# Patient Record
Sex: Female | Born: 1985 | Race: White | Hispanic: No | Marital: Married | Smoking: Never smoker
Health system: Southern US, Community
[De-identification: ages and names within clinical notes are randomized; demographics above are authoritative.]

## PROBLEM LIST (undated history)

## (undated) DIAGNOSIS — R7611 Nonspecific reaction to tuberculin skin test without active tuberculosis: Secondary | ICD-10-CM

## (undated) DIAGNOSIS — R1310 Dysphagia, unspecified: Secondary | ICD-10-CM

## (undated) HISTORY — PX: NECK SURGERY: SHX720

## (undated) HISTORY — DX: Dysphagia, unspecified: R13.10

## (undated) HISTORY — DX: Nonspecific reaction to tuberculin skin test without active tuberculosis: R76.11

---

## 2010-09-28 ENCOUNTER — Other Ambulatory Visit: Payer: Self-pay | Admitting: Infectious Diseases

## 2010-09-28 ENCOUNTER — Ambulatory Visit
Admission: RE | Admit: 2010-09-28 | Discharge: 2010-09-28 | Disposition: A | Discharge status: Home / Self Care | Payer: No Typology Code available for payment source | Source: Ambulatory Visit | Attending: Infectious Diseases | Admitting: Infectious Diseases

## 2010-09-28 DIAGNOSIS — Z8611 Personal history of tuberculosis: Secondary | ICD-10-CM

## 2011-02-26 NOTE — L&D Delivery Note (Signed)
Delivery Note At 1:48 PM a viable and healthy female was delivered via Vaginal, Spontaneous Delivery (Presentation: Left Occiput Anterior).  Loose nuchal x 1 reduced without difficulty.  APGAR: 9, 9; crying on perineum; weight pending .   Placenta status: Intact, Spontaneous.  Cord: 3 vessels with the following complications: None.    Anesthesia: Local  Episiotomy: None Lacerations: 2nd degree;Perineal Suture Repair: 3.0 Monocryl Est. Blood Loss (mL): 350  Mom to postpartum.  Baby to nursery-stable.  [facility] 11/01/2011, 2:25 PM

## 2011-07-31 ENCOUNTER — Other Ambulatory Visit (HOSPITAL_COMMUNITY): Payer: Self-pay | Admitting: Family

## 2011-07-31 DIAGNOSIS — O0930 Supervision of pregnancy with insufficient antenatal care, unspecified trimester: Secondary | ICD-10-CM

## 2011-07-31 DIAGNOSIS — Z3689 Encounter for other specified antenatal screening: Secondary | ICD-10-CM

## 2011-07-31 LAB — OB RESULTS CONSOLE ANTIBODY SCREEN: Antibody Screen: NEGATIVE

## 2011-07-31 LAB — OB RESULTS CONSOLE GC/CHLAMYDIA: Chlamydia: NEGATIVE

## 2011-07-31 LAB — OB RESULTS CONSOLE HEPATITIS B SURFACE ANTIGEN: Hepatitis B Surface Ag: NEGATIVE

## 2011-07-31 LAB — OB RESULTS CONSOLE ABO/RH: RH Type: POSITIVE

## 2011-08-05 ENCOUNTER — Ambulatory Visit (HOSPITAL_COMMUNITY)
Admission: RE | Admit: 2011-08-05 | Discharge: 2011-08-05 | Disposition: A | Discharge status: Home / Self Care | Payer: Medicaid Other | Source: Ambulatory Visit | Attending: Family | Admitting: Family

## 2011-08-05 ENCOUNTER — Encounter (HOSPITAL_COMMUNITY): Payer: Self-pay

## 2011-08-05 DIAGNOSIS — Z3689 Encounter for other specified antenatal screening: Secondary | ICD-10-CM

## 2011-08-05 DIAGNOSIS — O358XX0 Maternal care for other (suspected) fetal abnormality and damage, not applicable or unspecified: Secondary | ICD-10-CM | POA: Insufficient documentation

## 2011-08-05 DIAGNOSIS — O0930 Supervision of pregnancy with insufficient antenatal care, unspecified trimester: Secondary | ICD-10-CM

## 2011-08-05 DIAGNOSIS — Z363 Encounter for antenatal screening for malformations: Secondary | ICD-10-CM | POA: Insufficient documentation

## 2011-08-05 DIAGNOSIS — Z1389 Encounter for screening for other disorder: Secondary | ICD-10-CM | POA: Insufficient documentation

## 2011-09-13 ENCOUNTER — Other Ambulatory Visit (HOSPITAL_COMMUNITY): Payer: Self-pay | Admitting: Nurse Practitioner

## 2011-09-17 ENCOUNTER — Ambulatory Visit (HOSPITAL_COMMUNITY)
Admission: RE | Admit: 2011-09-17 | Discharge: 2011-09-17 | Disposition: A | Discharge status: Home / Self Care | Payer: Medicaid Other | Source: Ambulatory Visit | Attending: Nurse Practitioner | Admitting: Nurse Practitioner

## 2011-09-17 DIAGNOSIS — Z3689 Encounter for other specified antenatal screening: Secondary | ICD-10-CM | POA: Insufficient documentation

## 2011-09-17 DIAGNOSIS — O365990 Maternal care for other known or suspected poor fetal growth, unspecified trimester, not applicable or unspecified: Secondary | ICD-10-CM | POA: Insufficient documentation

## 2011-09-24 ENCOUNTER — Other Ambulatory Visit (HOSPITAL_COMMUNITY): Payer: Self-pay | Admitting: Nurse Practitioner

## 2011-09-25 LAB — OB RESULTS CONSOLE GBS: GBS: NEGATIVE

## 2011-10-01 ENCOUNTER — Ambulatory Visit (HOSPITAL_COMMUNITY)
Admission: RE | Admit: 2011-10-01 | Discharge: 2011-10-01 | Disposition: A | Discharge status: Home / Self Care | Payer: Medicaid Other | Source: Ambulatory Visit | Attending: Nurse Practitioner | Admitting: Nurse Practitioner

## 2011-10-01 DIAGNOSIS — O365990 Maternal care for other known or suspected poor fetal growth, unspecified trimester, not applicable or unspecified: Secondary | ICD-10-CM | POA: Insufficient documentation

## 2011-10-01 DIAGNOSIS — Z3689 Encounter for other specified antenatal screening: Secondary | ICD-10-CM | POA: Insufficient documentation

## 2011-10-21 ENCOUNTER — Telehealth (HOSPITAL_COMMUNITY): Payer: Self-pay | Admitting: *Deleted

## 2011-10-21 ENCOUNTER — Encounter (HOSPITAL_COMMUNITY): Payer: Self-pay | Admitting: *Deleted

## 2011-10-21 NOTE — Telephone Encounter (Signed)
Preadmission screen  

## 2011-10-22 ENCOUNTER — Inpatient Hospital Stay (HOSPITAL_COMMUNITY)
Admission: AD | Admit: 2011-10-22 | Payer: Medicaid Other | Source: Ambulatory Visit | Admitting: Obstetrics & Gynecology

## 2011-10-22 ENCOUNTER — Encounter (HOSPITAL_COMMUNITY): Payer: Self-pay | Admitting: *Deleted

## 2011-10-22 NOTE — Telephone Encounter (Signed)
Preadmission screen Interpreter number [phone]

## 2011-10-24 ENCOUNTER — Ambulatory Visit (INDEPENDENT_AMBULATORY_CARE_PROVIDER_SITE_OTHER): Payer: Medicaid Other | Admitting: *Deleted

## 2011-10-24 VITALS — BP 110/70 | Wt 120.4 lb

## 2011-10-24 DIAGNOSIS — O480 Post-term pregnancy: Secondary | ICD-10-CM

## 2011-10-24 NOTE — Progress Notes (Signed)
P = 105   Copy of report faxed to [facility].  Pt is scheduled for IOL on 10/31/11 @ 1930.

## 2011-10-31 ENCOUNTER — Encounter (HOSPITAL_COMMUNITY): Payer: Self-pay

## 2011-10-31 ENCOUNTER — Inpatient Hospital Stay (HOSPITAL_COMMUNITY)
Admission: RE | Admit: 2011-10-31 | Discharge: 2011-11-03 | DRG: 775 | Disposition: A | Discharge status: Home / Self Care | Payer: Medicaid Other | Source: Ambulatory Visit | Attending: Obstetrics & Gynecology | Admitting: Obstetrics & Gynecology

## 2011-10-31 DIAGNOSIS — O480 Post-term pregnancy: Principal | ICD-10-CM | POA: Diagnosis present

## 2011-10-31 LAB — CBC
MCV: 91.8 fL (ref 78.0–100.0)
Platelets: 245 10*3/uL (ref 150–400)
RBC: 3.91 MIL/uL (ref 3.87–5.11)
RDW: 14.2 % (ref 11.5–15.5)
WBC: 11.6 10*3/uL — ABNORMAL HIGH (ref 4.0–10.5)

## 2011-10-31 MED ORDER — ONDANSETRON HCL 4 MG/2ML IJ SOLN: 4.0000 mg | Freq: Four times a day (QID) | INTRAMUSCULAR | Status: DC | PRN

## 2011-10-31 MED ORDER — CITRIC ACID-SODIUM CITRATE 334-500 MG/5ML PO SOLN: 30.0000 mL | ORAL | Status: DC | PRN

## 2011-10-31 MED ORDER — LACTATED RINGERS IV SOLN: 500.0000 mL | INTRAVENOUS | Status: DC | PRN

## 2011-10-31 MED ORDER — IBUPROFEN 600 MG PO TABS: 600.0000 mg | ORAL_TABLET | Freq: Four times a day (QID) | ORAL | Status: DC | PRN

## 2011-10-31 MED ORDER — LACTATED RINGERS IV SOLN
INTRAVENOUS | Status: DC
  Administered 2011-11-01 (×2): via INTRAVENOUS

## 2011-10-31 MED ORDER — OXYTOCIN 40 UNITS IN LACTATED RINGERS INFUSION - SIMPLE MED
62.5000 mL/h | Freq: Once | INTRAVENOUS | Status: AC
  Administered 2011-11-01: 62.5 mL/h via INTRAVENOUS
  Filled 2011-10-31: qty 1000

## 2011-10-31 MED ORDER — FLEET ENEMA 7-19 GM/118ML RE ENEM: 1.0000 | ENEMA | RECTAL | Status: DC | PRN

## 2011-10-31 MED ORDER — MISOPROSTOL 25 MCG QUARTER TABLET
25.0000 ug | ORAL_TABLET | ORAL | Status: DC | PRN
  Administered 2011-10-31 – 2011-11-01 (×2): 25 ug via VAGINAL
  Filled 2011-10-31 (×2): qty 0.25

## 2011-10-31 MED ORDER — OXYTOCIN BOLUS FROM INFUSION
500.0000 mL | Freq: Once | INTRAVENOUS | Status: DC
  Filled 2011-10-31: qty 500

## 2011-10-31 MED ORDER — TERBUTALINE SULFATE 1 MG/ML IJ SOLN: 0.2500 mg | Freq: Once | INTRAMUSCULAR | Status: AC | PRN

## 2011-10-31 MED ORDER — LIDOCAINE HCL (PF) 1 % IJ SOLN
30.0000 mL | INTRAMUSCULAR | Status: DC | PRN
  Administered 2011-11-01: 30 mL via SUBCUTANEOUS

## 2011-10-31 MED ORDER — OXYCODONE-ACETAMINOPHEN 5-325 MG PO TABS: 1.0000 | ORAL_TABLET | ORAL | Status: DC | PRN

## 2011-10-31 MED ORDER — ACETAMINOPHEN 325 MG PO TABS: 650.0000 mg | ORAL_TABLET | ORAL | Status: DC | PRN

## 2011-10-31 NOTE — H&P (Signed)
Deborah Chan is a 26 y.o. female presenting for IOL for post-dates. Dating by LMP c/w 2nd trimester sono. History taken with patient with husband translating at times.  Maternal Medical History:  Reason for admission: Reason for Admission:   nauseaInduction of labor for post-dates.  Contractions: Frequency: irregular.   Perceived severity is mild.    Fetal activity: Perceived fetal activity is normal.   Last perceived fetal movement was within the past hour.    Prenatal complications: no prenatal complications Prenatal Complications - Diabetes: none.    OB History    Grav Para Term Preterm Abortions TAB SAB Ect Mult Living   1              Past Medical History  Diagnosis Date  . Swallowing difficulty   . Positive PPD, treated    Past Surgical History  Procedure Date  . 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 Social History:  reports that she has never smoked. She has never used smokeless tobacco. She reports that she does not drink alcohol or use illicit drugs.   Prenatal Transfer Tool  Maternal Diabetes: No Genetic Screening: Declined Maternal Ultrasounds/Referrals: Normal Fetal Ultrasounds or other Referrals:  None Maternal Substance Abuse:  No Significant Maternal Medications:  None Significant Maternal Lab Results:  Lab values include: Group B Strep negative Other Comments:  None  Review of Systems  Constitutional: Negative for fever and chills.  Eyes: Negative for blurred vision and double vision.  Respiratory: Negative for shortness of breath.   Cardiovascular: Negative for chest pain.  Gastrointestinal: Negative for nausea, vomiting and abdominal pain.  Genitourinary: Negative for dysuria.  Musculoskeletal: Negative for back pain.  Neurological: Negative for dizziness and headaches.    Dilation: 1 Effacement (%): 50 Station: -3 Exam by:: Dr  Thad Ranger Blood pressure 108/69, pulse 88, temperature 97.7 F (36.5 C), temperature source Oral, resp. rate 18, height 4\' 9"  (1.448 m), weight 54.432 kg (120 lb), last menstrual period 01/14/2011. Maternal Exam:  Abdomen: Fundal height is appropriate for gestational age.   Estimated fetal weight is AGA.   Fetal presentation: vertex  Pelvis: adequate for delivery.   Cervix: Cervix evaluated by digital exam.     Fetal Exam Fetal Monitor Review: Mode: ultrasound.   Baseline rate: 145.  Variability: moderate (6-25 bpm).   Pattern: accelerations present and no decelerations.    Fetal State Assessment: Category I - tracings are normal.     Physical Exam  Constitutional: She is oriented to person, place, and time. She appears well-developed and well-nourished. No distress.  HENT:  Head: Normocephalic and atraumatic.  Eyes: Conjunctivae and EOM are normal.  Neck: Normal range of motion.  Cardiovascular: Normal rate and regular rhythm.   Respiratory: Effort normal. No respiratory distress.  GI: Soft. There is no tenderness. There is no rebound and no guarding.  Genitourinary: Vagina normal.  Musculoskeletal: Normal range of motion. She exhibits no edema and no tenderness.  Neurological: She is alert and oriented to person, place, and time.  Skin: Skin is warm and dry.  Psychiatric: She has a normal mood and affect.    Prenatal labs: ABO, Rh: B/Positive/-- (06/05 0000) Antibody: Negative (06/05 0000) Rubella: Immune (06/05 0000) RPR: Nonreactive (06/05 0000)  HBsAg: Negative (06/05 0000)  HIV: Non-reactive (06/05 0000)  GBS: Negative (07/31 0000)   Assessment/Plan: 26 y.o. G1P0 at  [redacted]w[redacted]d here for IOL for post-dates. 1.  Cytotec. 2.  GBS negative 3. Pain control with nubain, then epidural 4.  Anticipate NSVD.      Napoleon Form 10/31/2011, 9:12 PM

## 2011-11-01 ENCOUNTER — Encounter (HOSPITAL_COMMUNITY): Payer: Self-pay

## 2011-11-01 DIAGNOSIS — O480 Post-term pregnancy: Secondary | ICD-10-CM

## 2011-11-01 LAB — TYPE AND SCREEN
ABO/RH(D): B POS
Antibody Screen: NEGATIVE

## 2011-11-01 LAB — RPR: RPR Ser Ql: NONREACTIVE

## 2011-11-01 MED ORDER — SIMETHICONE 80 MG PO CHEW: 80.0000 mg | CHEWABLE_TABLET | ORAL | Status: DC | PRN

## 2011-11-01 MED ORDER — PRENATAL MULTIVITAMIN CH
1.0000 | ORAL_TABLET | Freq: Every day | ORAL | Status: DC
  Administered 2011-11-01 – 2011-11-03 (×3): 1 via ORAL
  Filled 2011-11-01 (×2): qty 1

## 2011-11-01 MED ORDER — EPHEDRINE 5 MG/ML INJ: 10.0000 mg | INTRAVENOUS | Status: DC | PRN

## 2011-11-01 MED ORDER — DIBUCAINE 1 % RE OINT: 1.0000 "application " | TOPICAL_OINTMENT | RECTAL | Status: DC | PRN

## 2011-11-01 MED ORDER — PHENYLEPHRINE 40 MCG/ML (10ML) SYRINGE FOR IV PUSH (FOR BLOOD PRESSURE SUPPORT): 80.0000 ug | PREFILLED_SYRINGE | INTRAVENOUS | Status: DC | PRN

## 2011-11-01 MED ORDER — LACTATED RINGERS IV SOLN: 500.0000 mL | Freq: Once | INTRAVENOUS | Status: DC

## 2011-11-01 MED ORDER — DIPHENHYDRAMINE HCL 25 MG PO CAPS: 25.0000 mg | ORAL_CAPSULE | Freq: Four times a day (QID) | ORAL | Status: DC | PRN

## 2011-11-01 MED ORDER — BENZOCAINE-MENTHOL 20-0.5 % EX AERO
1.0000 "application " | INHALATION_SPRAY | CUTANEOUS | Status: DC | PRN
  Administered 2011-11-01: 1 via TOPICAL
  Filled 2011-11-01: qty 56

## 2011-11-01 MED ORDER — OXYCODONE-ACETAMINOPHEN 5-325 MG PO TABS: 1.0000 | ORAL_TABLET | ORAL | Status: DC | PRN

## 2011-11-01 MED ORDER — DIPHENHYDRAMINE HCL 50 MG/ML IJ SOLN: 12.5000 mg | INTRAMUSCULAR | Status: DC | PRN

## 2011-11-01 MED ORDER — LANOLIN HYDROUS EX OINT: TOPICAL_OINTMENT | CUTANEOUS | Status: DC | PRN

## 2011-11-01 MED ORDER — BUTORPHANOL TARTRATE 1 MG/ML IJ SOLN
1.0000 mg | INTRAMUSCULAR | Status: DC | PRN
  Administered 2011-11-01: 1 mg via INTRAVENOUS
  Filled 2011-11-01: qty 1

## 2011-11-01 MED ORDER — FENTANYL 2.5 MCG/ML BUPIVACAINE 1/10 % EPIDURAL INFUSION (WH - ANES): 14.0000 mL/h | INTRAMUSCULAR | Status: DC

## 2011-11-01 MED ORDER — WITCH HAZEL-GLYCERIN EX PADS: 1.0000 "application " | MEDICATED_PAD | CUTANEOUS | Status: DC | PRN

## 2011-11-01 MED ORDER — IBUPROFEN 600 MG PO TABS
600.0000 mg | ORAL_TABLET | Freq: Four times a day (QID) | ORAL | Status: DC
  Administered 2011-11-01 – 2011-11-03 (×8): 600 mg via ORAL
  Filled 2011-11-01 (×8): qty 1

## 2011-11-01 MED ORDER — ZOLPIDEM TARTRATE 5 MG PO TABS: 5.0000 mg | ORAL_TABLET | Freq: Every evening | ORAL | Status: DC | PRN

## 2011-11-01 MED ORDER — NALBUPHINE SYRINGE 5 MG/0.5 ML
10.0000 mg | INJECTION | INTRAMUSCULAR | Status: DC | PRN
  Administered 2011-11-01: 10 mg via INTRAVENOUS
  Filled 2011-11-01 (×2): qty 1

## 2011-11-01 MED ORDER — NALBUPHINE HCL 10 MG/ML IJ SOLN: 10.0000 mg | INTRAMUSCULAR | Status: DC | PRN

## 2011-11-01 MED ORDER — SENNOSIDES-DOCUSATE SODIUM 8.6-50 MG PO TABS
2.0000 | ORAL_TABLET | Freq: Every day | ORAL | Status: DC
  Administered 2011-11-01 – 2011-11-02 (×2): 2 via ORAL

## 2011-11-01 MED ORDER — ONDANSETRON HCL 4 MG/2ML IJ SOLN: 4.0000 mg | INTRAMUSCULAR | Status: DC | PRN

## 2011-11-01 MED ORDER — ONDANSETRON HCL 4 MG PO TABS: 4.0000 mg | ORAL_TABLET | ORAL | Status: DC | PRN

## 2011-11-01 MED ORDER — TETANUS-DIPHTH-ACELL PERTUSSIS 5-2.5-18.5 LF-MCG/0.5 IM SUSP
0.5000 mL | Freq: Once | INTRAMUSCULAR | Status: AC
  Administered 2011-11-02: 0.5 mL via INTRAMUSCULAR
  Filled 2011-11-01: qty 0.5

## 2011-11-01 MED ORDER — NALBUPHINE SYRINGE 5 MG/0.5 ML
10.0000 mg | INJECTION | INTRAMUSCULAR | Status: DC | PRN
  Administered 2011-11-01: 10 mg via INTRAVENOUS
  Filled 2011-11-01: qty 1

## 2011-11-01 NOTE — Progress Notes (Signed)
Deborah Chan is a 26 y.o. G1P0 at [redacted]w[redacted]d   Subjective: Pt having some increased pain- asking for more IV pain medications.  Does NOT want an epidural currently, would rather stay with IV pain medications.  Made husband and pt aware she can have epidural at any time if desired.   Objective: BP 112/79  Pulse 87  Temp 98.1 F (36.7 C) (Oral)  Resp 18  Ht 4\' 9"  (1.448 m)  Wt 54.432 kg (120 lb)  BMI 25.97 kg/m2  LMP 01/14/2011      FHT:  FHR: 125 bpm, variability: moderate,  accelerations:  Present,  decelerations:  Absent UC:   regular, every 1-2 minutes SVE:   5-6/90/-1 to 0, vertex AROM @ 0950, light mec  Labs: Lab Results  Component Value Date   WBC 11.6* 10/31/2011   HGB 12.1 10/31/2011   HCT 35.9* 10/31/2011   MCV 91.8 10/31/2011   PLT 245 10/31/2011    Assessment / Plan: Induction of labor due to postterm,  progressing well on pitocin  Labor: s/p cytotec x2 and foley bulb (out at 950am), s/p AROM @ 950 AM; no pitocin needed currently Preeclampsia:  n/a Fetal Wellbeing:  Category I Pain Control:  Nubain, stadol I/D:  n/a Anticipated MOD:  NSVD  Deborah Chan 11/01/2011, 9:57 AM

## 2011-11-01 NOTE — Progress Notes (Signed)
Otie Sleep is a 26 y.o. G1P0 at [redacted]w[redacted]d by LMP c/w 2nd trimester U/S admitted for induction of labor due to Post dates.  Subjective: Doing ok, IV pain medication working well.  Husband translating. Pt gives verbal agreement to have foley bulb placed  Objective: BP 110/70  Pulse 90  Temp 98.5 F (36.9 C) (Oral)  Resp 18  Ht 4\' 9"  (1.448 m)  Wt 54.432 kg (120 lb)  BMI 25.97 kg/m2  LMP 01/14/2011      FHT:  FHR: 130 bpm, variability: minimal ,  accelerations:  Present,  decelerations:  Absent UC:   regular, every 1-2 minutes SVE:  2/90/-1 to 0, vertex @0745  by [facility]  Labs: Lab Results  Component Value Date   WBC 11.6* 10/31/2011   HGB 12.1 10/31/2011   HCT 35.9* 10/31/2011   MCV 91.8 10/31/2011   PLT 245 10/31/2011    Assessment / Plan: IOL 2/2 post-dates; s/p cytotec x2, now with foley bulb in place  Labor: s/p cytotec, foley bulb placed ~ 0745, will start pitocin if needed once FB comes out Preeclampsia:  N/A Fetal Wellbeing:  Category I Pain Control:  IV pain meds, pt desires epidural in the future I/D:  n/a Anticipated MOD:  NSVD  Kimyata Milich 11/01/2011, 7:49 AM

## 2011-11-01 NOTE — Progress Notes (Signed)
Patient ID: Hinton Rao, female   DOB: 02-15-1986, 26 y.o.   MRN: [id number]  Pt is a 26 y.o. G1P0 at [redacted]w[redacted]d here for IOL for post-dates.  S/p first dose of cytotec at 9 PM. Pt sleeping comfortably, states feels pain occasionally.  O:  Dilation: 1 Effacement (%): 50 Cervical Position: Posterior Station: -2 Presentation: Vertex Exam by:: Dr Thad Ranger (for Cytotec placement)  Second dose cytotec placed.  FHTs: Baseline - 135,  Mod variability, accels present, no decels Toco:q 6-8 minutes.  Plan 2nd dose cytotec. Can place foley bulb in AM.  Napoleon Form, MD 11/01/2011 1:55 AM

## 2011-11-02 NOTE — Progress Notes (Signed)
Post Partum Day 1 Subjective: no complaints, up ad lib, voiding and tolerating PO, small lochia, plans to breastfeed, lactation referral, undecided on contraception  Objective: Blood pressure 119/81, pulse 98, temperature 97.9 F (36.6 C), temperature source Oral, resp. rate 19, height 4\' 9"  (1.448 m), weight 54.432 kg (120 lb), last menstrual period 01/14/2011, SpO2 98.00%, unknown if currently breastfeeding.  Physical Exam:  General: alert, cooperative and no distress Lochia:normal flow Chest: CTAB Heart: RRR no m/r/g Abdomen: +BS, soft, nontender,  Uterine Fundus: firm DVT Evaluation: No evidence of DVT seen on physical exam. Extremities: none edema   Basename 10/31/11 2000  HGB 12.1  HCT 35.9*    Assessment/Plan: Plan for discharge tomorrow, Breastfeeding and Lactation consult   LOS: 2 days   Astou Lada N 11/02/2011, 6:58 AM

## 2011-11-02 NOTE — Progress Notes (Signed)
I was present for the exam and agree with above.  [place], CNM 11/02/2011 8:06 AM

## 2011-11-03 MED ORDER — ACETAMINOPHEN-CODEINE 300-30 MG PO TABS: 1.0000 | ORAL_TABLET | ORAL | Status: AC | PRN

## 2011-11-03 MED ORDER — IBUPROFEN 600 MG PO TABS: 600.0000 mg | ORAL_TABLET | Freq: Four times a day (QID) | ORAL | Status: AC

## 2011-11-03 NOTE — Discharge Summary (Addendum)
Obstetric Discharge Summary Reason for Admission: induction of labor for postdates. Prenatal Procedures: ultrasound Intrapartum Procedures: spontaneous vaginal delivery Postpartum Procedures: none Complications-Operative and Postpartum: 2nd degree perineal laceration Hemoglobin  Date Value Range Status  10/31/2011 12.1  12.0 - 15.0 g/dL Final     HCT  Date Value Range Status  10/31/2011 35.9* 36.0 - 46.0 % Final    Physical Exam:  Filed Vitals:   11/03/11 0540  BP: 103/72  Pulse: 90  Temp: 98.3 F (36.8 C)  Resp: 18    General: alert, cooperative and appears stated age Lochia: appropriate Uterine Fundus: firm Incision: n/a DVT Evaluation: No evidence of DVT seen on physical exam. Negative Homan's sign.  Discharge Diagnoses: Post-date pregnancy  Discharge Information: Date: 11/03/2011 Activity: pelvic rest Diet: routine Medications: Tylenol #3 and Ibuprofen Condition: stable Instructions: refer to practice specific booklet Discharge to: home Follow-up Information    Follow up with [facility] in 6 weeks.       Desires Depo_provera  Newborn Data: Live born female  Birth Weight: 6 lb 11.9 oz (3060 g) APGAR: 9, 9  Home with mother.  [facility] 11/03/2011, 9:39 AM

## 2011-11-03 NOTE — Discharge Summary (Signed)
Attestation of Attending Supervision of Advanced Practitioner (CNM/NP): Evaluation and management procedures were performed by the Advanced Practitioner under my supervision and collaboration.  I have reviewed the Advanced Practitioner's note and chart, and I agree with the management and plan.  HARRAWAY-SMITH, Preston Garabedian 11:40 AM      

## 2011-11-03 NOTE — Discharge Summary (Signed)
Attestation of Attending Supervision of Advanced Practitioner (CNM/NP): Evaluation and management procedures were performed by the Advanced Practitioner under my supervision and collaboration.  I have reviewed the Advanced Practitioner's note and chart, and I agree with the management and plan.  HARRAWAY-SMITH, Andrick Rust 9:49 AM     

## 2011-11-04 NOTE — Progress Notes (Signed)
Post discharge chart review completed.  

## 2011-11-11 NOTE — Progress Notes (Incomplete)
8/29 NST reviewed and reactive 

## 2013-02-25 NOTE — L&D Delivery Note (Signed)
Delivery Note At 11:41 AM a viable female was delivered by Dr. Jimmey RalphParker via Vaginal, Spontaneous Delivery (Presentation:OA>LOA ). Loose nuchal cord reduced over body APGAR:8, 9 ; weight  .   Placenta status: spontaneous intact 3V, .  Cord:  with the following complications: none.    Anesthesia: Local  Episiotomy: None Lacerations: 1st degree repaired by Filipe Greathouse CNM Suture Repair: 3.0 vicryl rapide Est. Blood Loss (mL): 300  Mom to postpartum.  Baby to Couplet care / Skin to Skin.  Brianca Fortenberry 12/27/2013, 11:59 AM

## 2013-10-25 ENCOUNTER — Other Ambulatory Visit (HOSPITAL_COMMUNITY): Payer: Self-pay | Admitting: Nurse Practitioner

## 2013-10-25 DIAGNOSIS — Z3689 Encounter for other specified antenatal screening: Secondary | ICD-10-CM

## 2013-10-25 LAB — OB RESULTS CONSOLE HIV ANTIBODY (ROUTINE TESTING): HIV: NONREACTIVE

## 2013-10-25 LAB — OB RESULTS CONSOLE GC/CHLAMYDIA
CHLAMYDIA, DNA PROBE: NEGATIVE
GC PROBE AMP, GENITAL: NEGATIVE

## 2013-10-27 ENCOUNTER — Ambulatory Visit (HOSPITAL_COMMUNITY)
Admission: RE | Admit: 2013-10-27 | Discharge: 2013-10-27 | Disposition: A | Discharge status: Home / Self Care | Payer: Medicaid Other | Source: Ambulatory Visit | Attending: Nurse Practitioner | Admitting: Nurse Practitioner

## 2013-10-27 DIAGNOSIS — Z3689 Encounter for other specified antenatal screening: Secondary | ICD-10-CM

## 2013-10-27 DIAGNOSIS — Z363 Encounter for antenatal screening for malformations: Secondary | ICD-10-CM | POA: Insufficient documentation

## 2013-10-27 DIAGNOSIS — Z1389 Encounter for screening for other disorder: Secondary | ICD-10-CM | POA: Insufficient documentation

## 2013-10-30 LAB — OB RESULTS CONSOLE RPR: RPR: NONREACTIVE

## 2013-11-09 IMAGING — US US OB FOLLOW-UP
1 series · 12 of 28 positions shown · non-contrast
Comparison: none

[Series 1: us ob follow up · 12 of 32 slices shown]
[im 2/32]
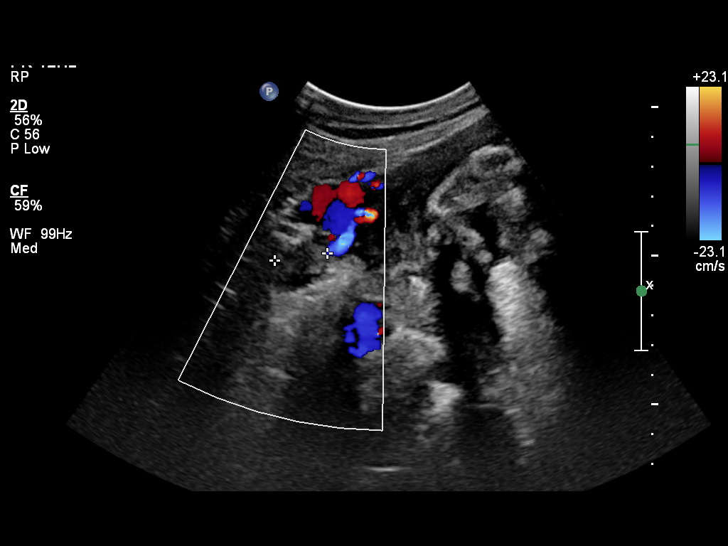
[im 4/32]
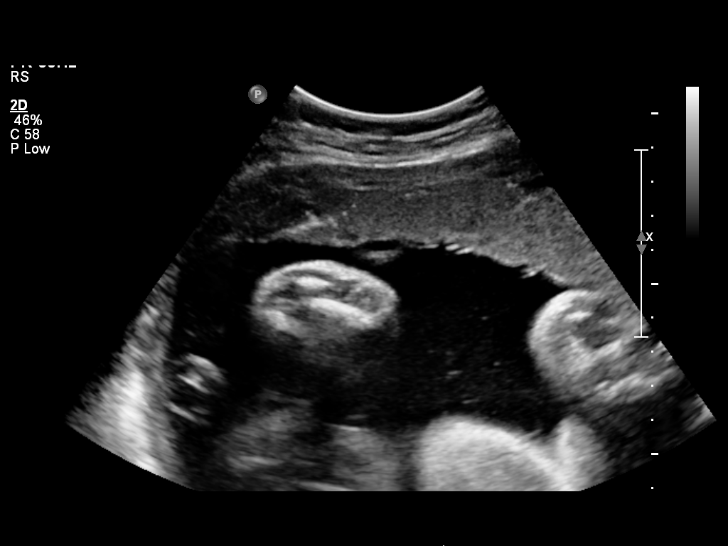
[im 6/32]
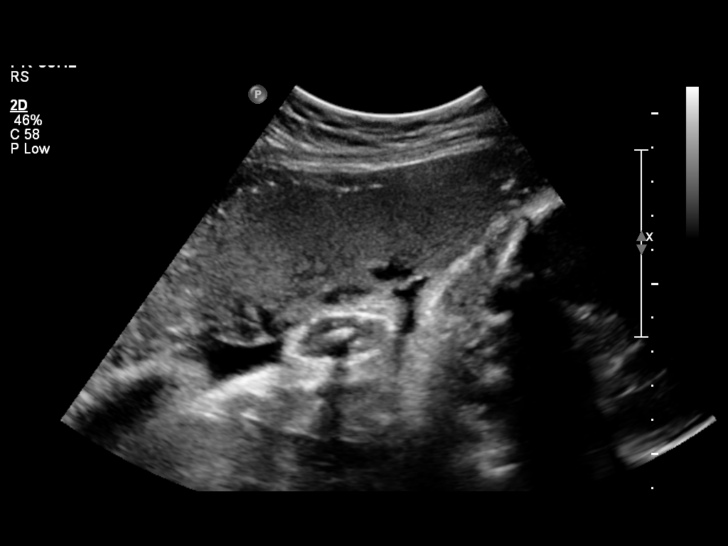
[im 10/32]
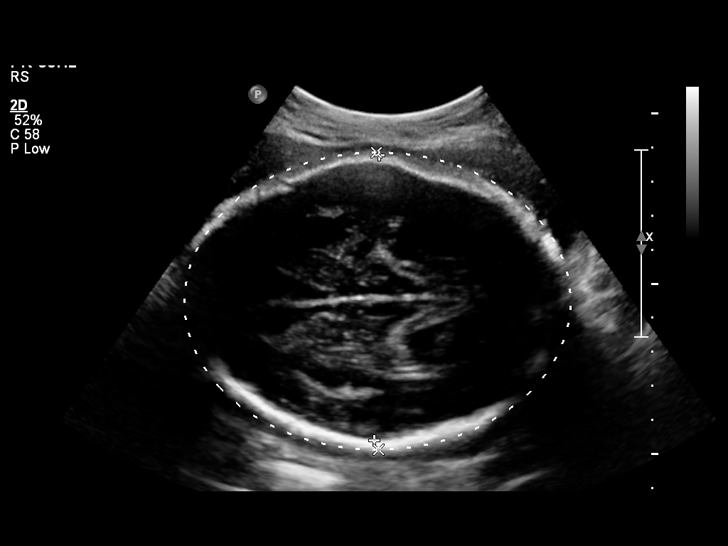
[im 12/32]
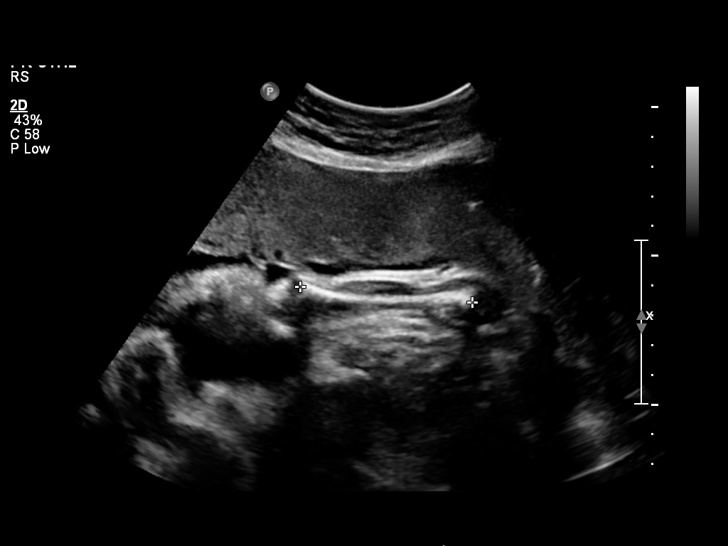
[im 14/32]
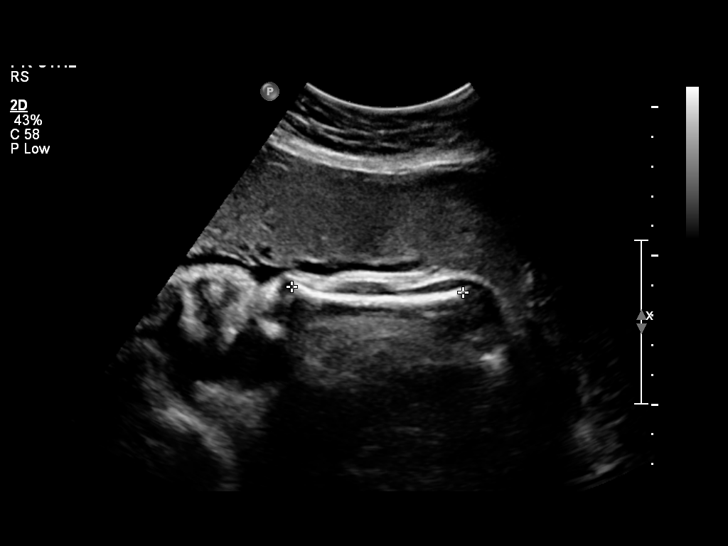
[im 18/32]
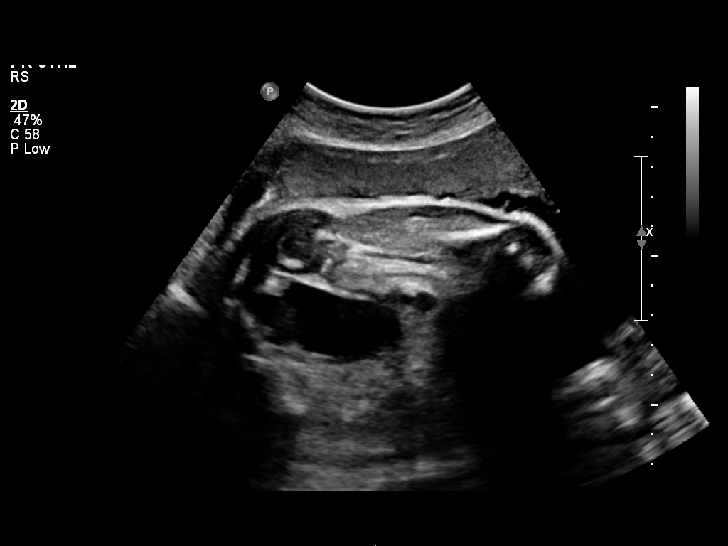
[im 20/32]
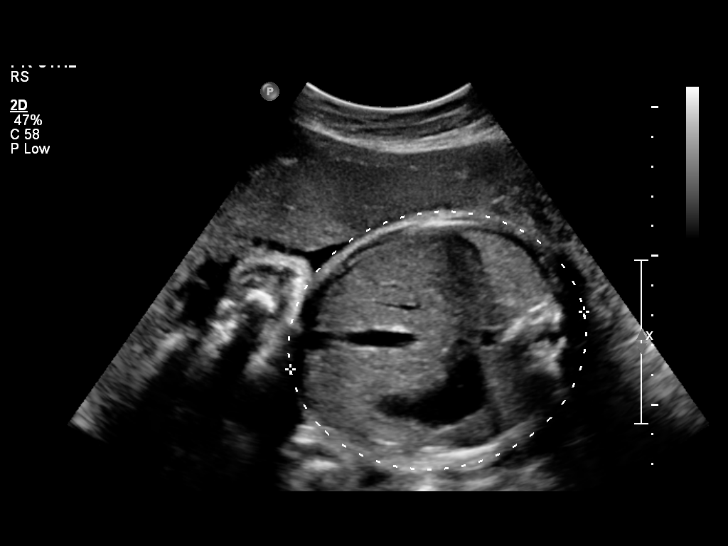
[im 22/32]
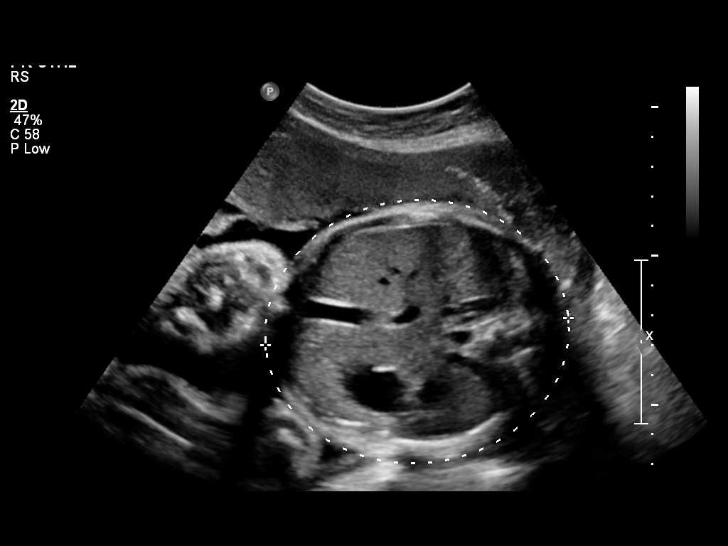
[im 26/32]
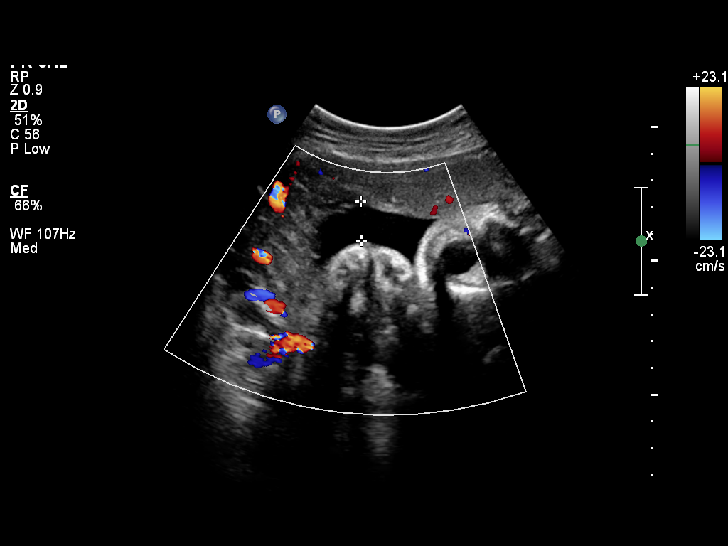
[im 28/32]
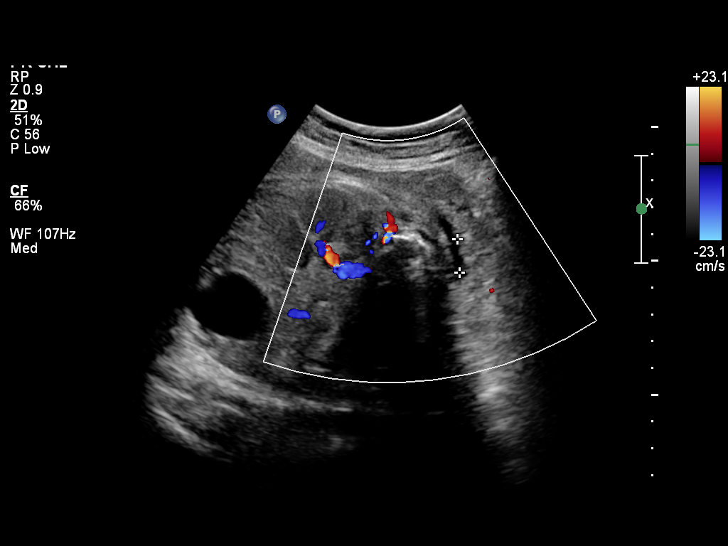
[im 30/32]
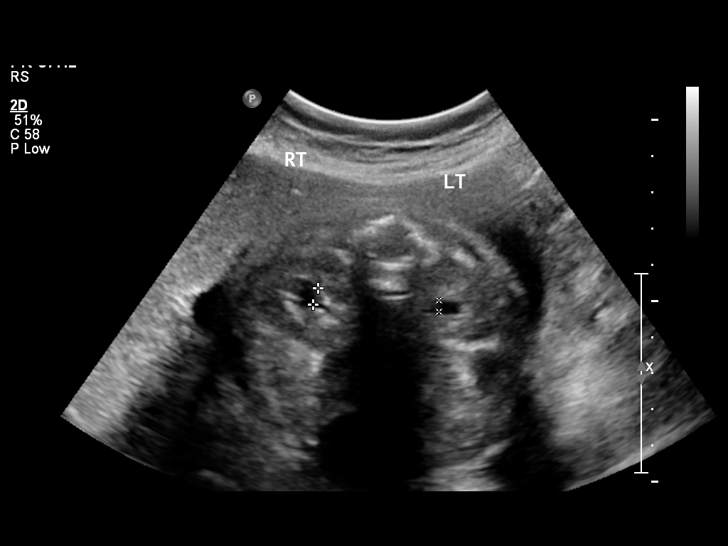

[12 of 28 positions shown; findings below may reference images not displayed]

OBSTETRICS REPORT
                      (Signed Final 09/17/2011 [DATE])

 Order#:         [id number]
Procedures

 US OB FOLLOW UP                                       76816.1
Indications

 Size less than dates (Small for gestational [AGE]
 FGR)
Fetal Evaluation

 Preg. Location:    Intrauterine
 Fetal Heart Rate:  136                         bpm
 Cardiac Activity:  Observed
 Presentation:      Cephalic
 Placenta:          Anterior, above cervical os
 P. Cord            Previously Visualized
 Insertion:

 Amniotic Fluid
 AFI FV:      Subjectively within normal limits
 AFI Sum:     10.92   cm      27   %Tile     Larg Pckt:   4.19   cm
 RUQ:   4.19   cm    RLQ:    1.25   cm    LUQ:   4.02    cm   LLQ:    1.46   cm
Biometry

 BPD:     83.9  mm    G. Age:   33w 5d                CI:        73.31   70 - 86
                                                      FL/HC:      20.6   20.1 -

 HC:     311.4  mm    G. Age:   34w 6d       14  %    HC/AC:      1.05   0.93 -

 AC:       296  mm    G. Age:   33w 4d       17  %    FL/BPD:     76.6   71 - 87
 FL:      64.3  mm    G. Age:   33w 2d        7  %    FL/AC:      21.7   20 - 24
 HUM:     57.4  mm    G. Age:   33w 2d       30  %
 Est. FW:    2280  gm    4 lb 15 oz      30  %
Gestational Age

 LMP:           35w 1d       Date:   01/14/11                 EDD:   10/21/11
 U/S Today:     33w 6d                                        EDD:   10/30/11
 Best:          35w 1d    Det. By:   LMP  (01/14/11)          EDD:   10/21/11
Anatomy
 Cranium:           Previously seen     Aortic Arch:       Previously seen
 Fetal Cavum:       Previously seen     Ductal Arch:       Previously seen
 Ventricles:        Previously seen     Diaphragm:         Previously seen
 Choroid Plexus:    Previously seen     Stomach:           Appears
                                                           normal, left
                                                           sided
 Cerebellum:        Previously seen     Abdomen:           Previously seen
 Posterior Fossa:   Previously seen     Abdominal Wall:    Previously seen
 Nuchal Fold:       Not applicable      Cord Vessels:      Previously seen
                    (>20 wks GA)
 Face:              Previously seen     Kidneys:           Appear normal
 Heart:             Appears normal      Bladder:           Appears normal
                    (4 chamber &
                    axis)
 RVOT:              Previously seen     Spine:             Previously seen
 LVOT:              Previously seen     Limbs:             Previously seen
Cervix Uterus Adnexa

 Cervical Length:   3.1       cm

 Cervix:       Not visualized (advanced GA >34 wks)
 Uterus:       No abnormality visualized.

 Left Ovary:   No adnexal mass visualized.
 Right Ovary:  No adnexal mass visualized.
Impression

  Single living intrauterine gestation in cephalic presentation
 with concordant fetal indices and linear growth.  The EFW
 today is at the 30th percentile (previously 29th %ile on
 08/05/11.).

 Thank you for sharing in the care of Ms. MOTOMI BARTNIK with
 questions or concerns.

## 2013-11-22 ENCOUNTER — Other Ambulatory Visit (HOSPITAL_COMMUNITY): Payer: Self-pay | Admitting: Urology

## 2013-11-22 DIAGNOSIS — Z3689 Encounter for other specified antenatal screening: Secondary | ICD-10-CM

## 2013-11-23 IMAGING — US US OB FOLLOW-UP
1 series · 12 of 28 positions shown · non-contrast
Comparison: none

[Series 1: us ob follow up · 12 of 40 slices shown]
[im 2/40]
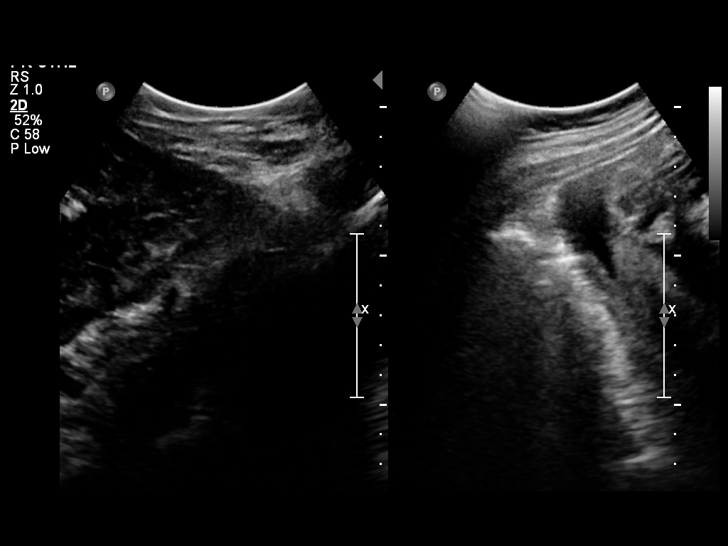
[im 5/40]
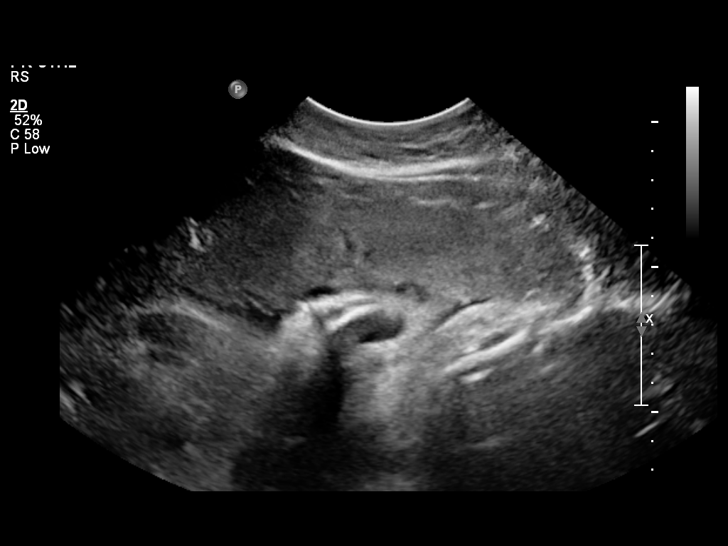
[im 8/40]
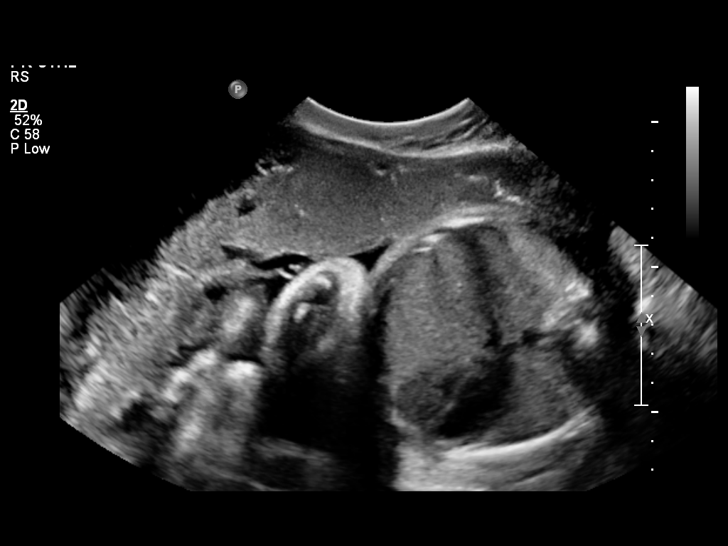
[im 12/40]
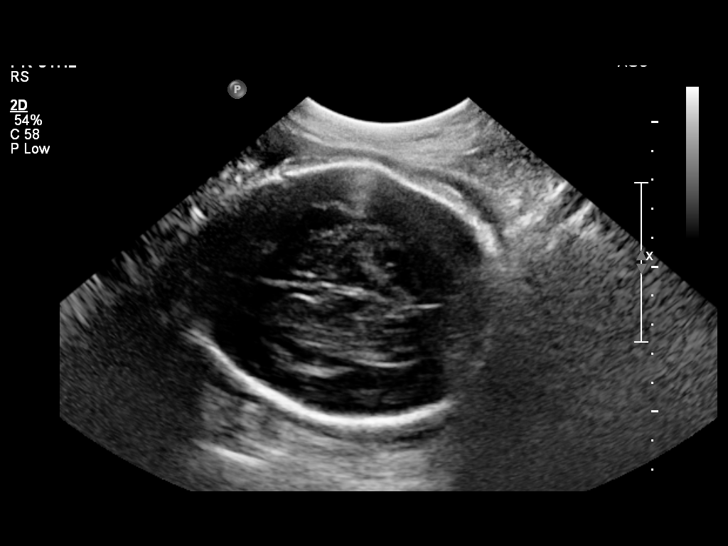
[im 15/40]
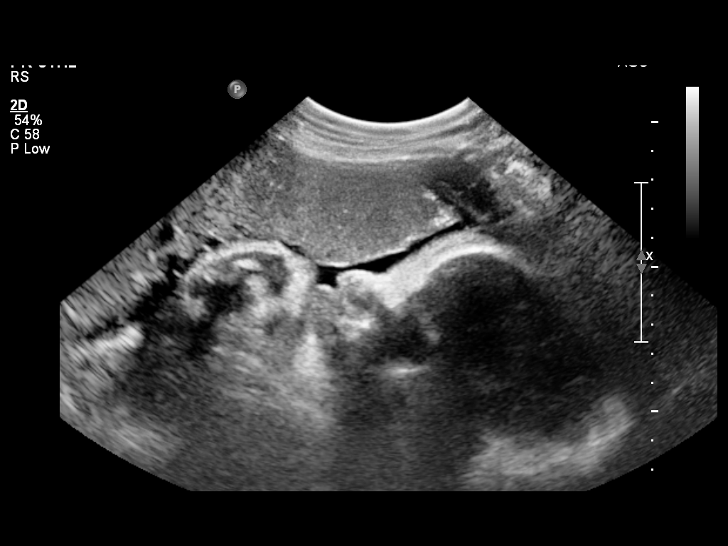
[im 18/40]
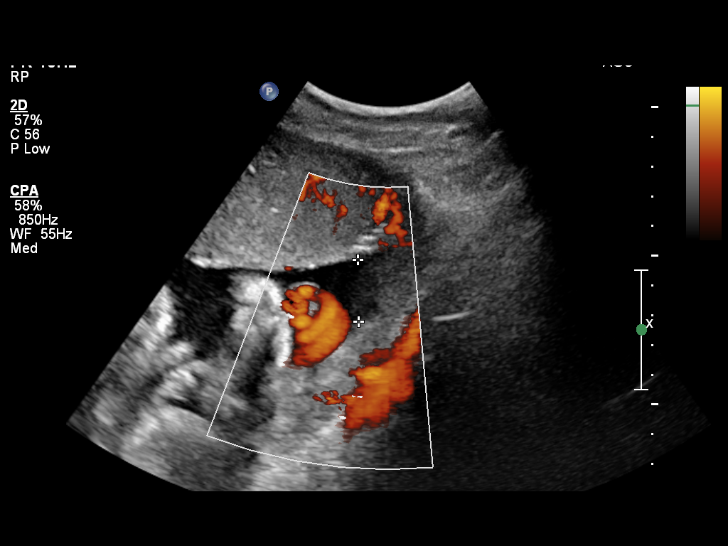
[im 22/40]
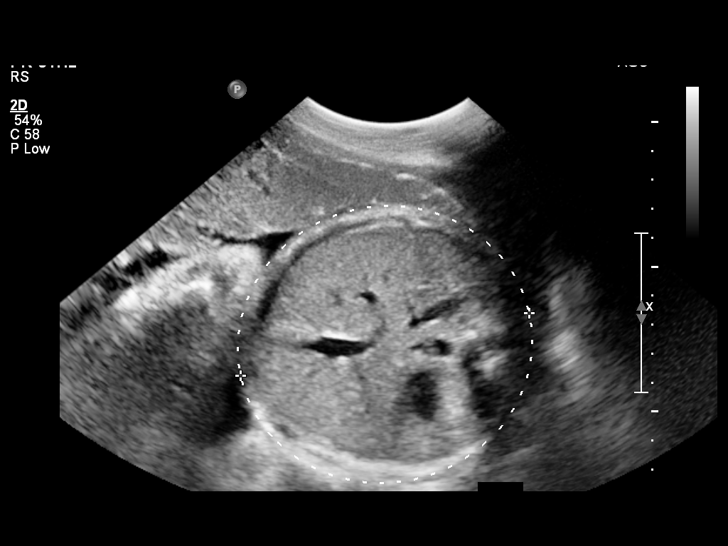
[im 25/40]
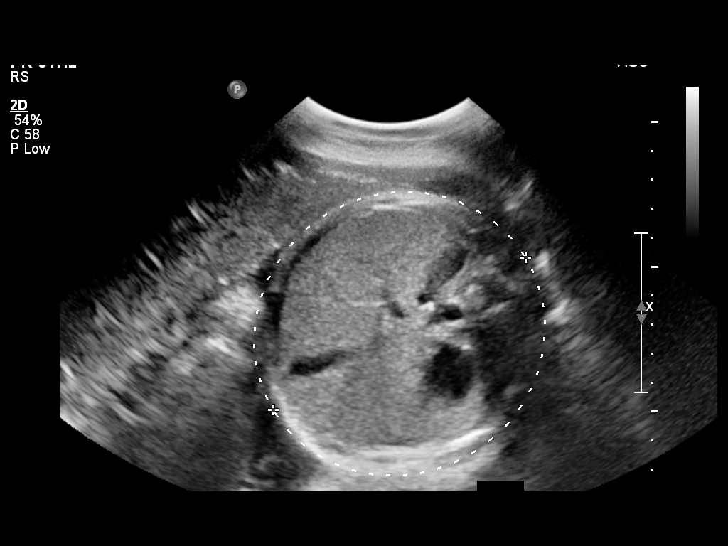
[im 28/40]
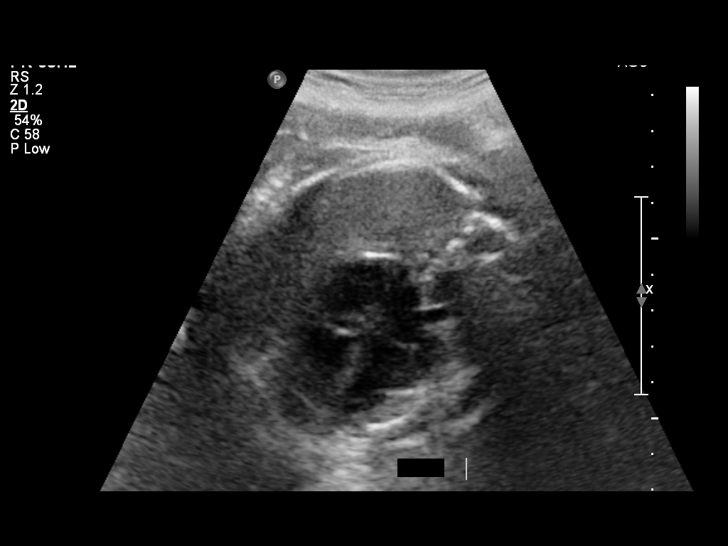
[im 32/40]
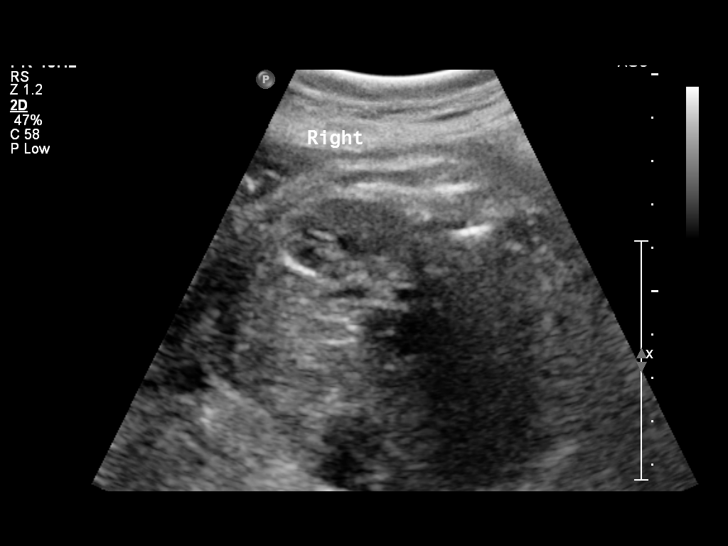
[im 35/40]
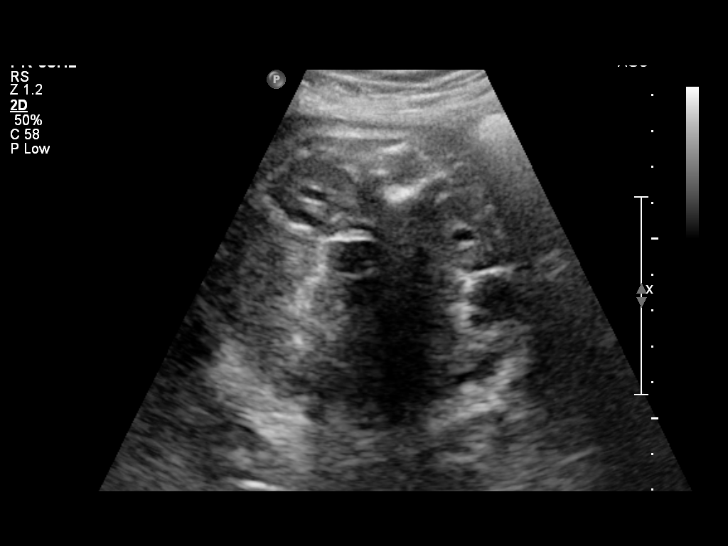
[im 38/40]
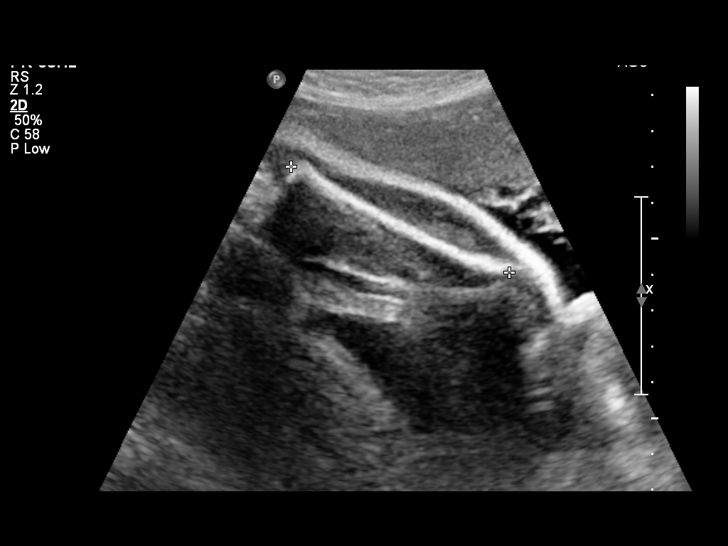

[12 of 28 positions shown; findings below may reference images not displayed]

OBSTETRICS REPORT
                      (Signed Final 10/01/2011 [DATE])

 Order#:         [id number]
Procedures

 US OB FOLLOW UP                                       76816.1
Indications

 Size less than dates (Small for gestational [AGE]
 FGR)
 Assess Fetal Growth / Estimated Fetal Weight
Fetal Evaluation

 Fetal Heart Rate:  156                         bpm
 Cardiac Activity:  Observed
 Presentation:      Cephalic
 Placenta:          Anterior, above cervical os
 P. Cord            Previously Visualized
 Insertion:

 Amniotic Fluid
 AFI FV:      Subjectively within normal limits
 AFI Sum:     12.73   cm      45   %Tile     Larg Pckt:   4.59   cm
 RUQ:   4.59   cm    RLQ:    2      cm    LUQ:   4.07    cm   LLQ:    2.07   cm
Biometry

 BPD:     88.9  mm    G. Age:   36w 0d                CI:        74.03   70 - 86
                                                      FL/HC:      20.6   20.8 -

 HC:     328.1  mm    G. Age:   37w 2d       28  %    HC/AC:      1.06   0.92 -

 AC:       310  mm    G. Age:   34w 6d       10  %    FL/BPD:     76.2   71 - 87
 FL:      67.7  mm    G. Age:   34w 5d        6  %    FL/AC:      21.8   20 - 24
 HUM:     59.4  mm    G. Age:   34w 3d       19  %

 Est. FW:    2617  gm    5 lb 13 oz      27  %
Gestational Age

 LMP:           37w 1d       Date:   01/14/11                 EDD:   10/21/11
 U/S Today:     35w 5d                                        EDD:   10/31/11
 Best:          37w 1d    Det. By:   LMP  (01/14/11)          EDD:   10/21/11
Anatomy
 Cranium:           Previously seen     Aortic Arch:       Previously seen
 Fetal Cavum:       Previously seen     Ductal Arch:       Previously seen
 Ventricles:        Previously seen     Diaphragm:         Appears normal
 Choroid Plexus:    Previously seen     Stomach:           Appears
                                                           normal, left
                                                           sided
 Cerebellum:        Previously seen     Abdomen:           Appears normal
 Posterior Fossa:   Previously seen     Abdominal Wall:    Previously seen
 Nuchal Fold:       Not applicable      Cord Vessels:      Previously seen
                    (>20 wks GA)
 Face:              Previously seen     Kidneys:           Appear normal
 Heart:             Appears normal      Bladder:           Appears normal
                    (4 chamber &
                    axis)
 RVOT:              Previously seen     Spine:             Previously seen
 LVOT:              Previously seen     Limbs:             Previously seen

 Other:     Fetus previously visualized as a female.
Cervix Uterus Adnexa

 Cervix:       Not visualized (advanced GA >34 wks)
 Uterus:       No abnormality visualized.
 Cul De Sac:   No free fluid seen.
 Left Ovary:   Within normal limits.
 Right Ovary:  Not visualized.

 Adnexa:     No abnormality visualized.
Impression

 Linear interval growth with EFW at 27th percentile (previously
 30 %ile on 09/17/11.)  Normal amniotic fluid volume.

 Thank you for sharing in the care of Ms. JIT BASKIN with
 questions or concerns.

## 2013-11-26 ENCOUNTER — Ambulatory Visit (HOSPITAL_COMMUNITY): Payer: No Typology Code available for payment source

## 2013-12-10 ENCOUNTER — Ambulatory Visit (HOSPITAL_COMMUNITY)
Admission: RE | Admit: 2013-12-10 | Discharge: 2013-12-10 | Disposition: A | Discharge status: Home / Self Care | Payer: No Typology Code available for payment source | Source: Ambulatory Visit | Attending: Urology | Admitting: Urology

## 2013-12-10 DIAGNOSIS — Z3689 Encounter for other specified antenatal screening: Secondary | ICD-10-CM

## 2013-12-10 DIAGNOSIS — Z3A36 36 weeks gestation of pregnancy: Secondary | ICD-10-CM | POA: Diagnosis not present

## 2013-12-10 DIAGNOSIS — Z36 Encounter for antenatal screening of mother: Secondary | ICD-10-CM | POA: Insufficient documentation

## 2013-12-10 LAB — OB RESULTS CONSOLE GBS: GBS: NEGATIVE

## 2013-12-26 ENCOUNTER — Inpatient Hospital Stay (HOSPITAL_COMMUNITY)
Admission: AD | Admit: 2013-12-26 | Discharge: 2013-12-26 | Disposition: A | Discharge status: Home / Self Care | Payer: No Typology Code available for payment source | Source: Ambulatory Visit | Attending: Family Medicine | Admitting: Family Medicine

## 2013-12-26 ENCOUNTER — Encounter (HOSPITAL_COMMUNITY): Payer: Self-pay | Admitting: *Deleted

## 2013-12-26 DIAGNOSIS — Z3A38 38 weeks gestation of pregnancy: Secondary | ICD-10-CM | POA: Insufficient documentation

## 2013-12-26 DIAGNOSIS — O4693 Antepartum hemorrhage, unspecified, third trimester: Secondary | ICD-10-CM | POA: Diagnosis present

## 2013-12-26 DIAGNOSIS — O471 False labor at or after 37 completed weeks of gestation: Secondary | ICD-10-CM | POA: Insufficient documentation

## 2013-12-26 NOTE — Discharge Instructions (Signed)
Braxton Hicks Contractions °Contractions of the uterus can occur throughout pregnancy. Contractions are not always a sign that you are in labor.  °WHAT ARE BRAXTON HICKS CONTRACTIONS?  °Contractions that occur before labor are called Braxton Hicks contractions, or false labor. Toward the end of pregnancy (32-34 weeks), these contractions can develop more often and may become more forceful. This is not true labor because these contractions do not result in opening (dilatation) and thinning of the cervix. They are sometimes difficult to tell apart from true labor because these contractions can be forceful and people have different pain tolerances. You should not feel embarrassed if you go to the hospital with false labor. Sometimes, the only way to tell if you are in true labor is for your health care provider to look for changes in the cervix. °If there are no prenatal problems or other health problems associated with the pregnancy, it is completely safe to be sent home with false labor and await the onset of true labor. °HOW CAN YOU TELL THE DIFFERENCE BETWEEN TRUE AND FALSE LABOR? °False Labor °· The contractions of false labor are usually shorter and not as hard as those of true labor.   °· The contractions are usually irregular.   °· The contractions are often felt in the front of the lower abdomen and in the groin.   °· The contractions may go away when you walk around or change positions while lying down.   °· The contractions get weaker and are shorter lasting as time goes on.   °· The contractions do not usually become progressively stronger, regular, and closer together as with true labor.   °True Labor °· Contractions in true labor last 30-70 seconds, become very regular, usually become more intense, and increase in frequency.   °· The contractions do not go away with walking.   °· The discomfort is usually felt in the top of the uterus and spreads to the lower abdomen and low back.   °· True labor can be  determined by your health care provider with an exam. This will show that the cervix is dilating and getting thinner.   °WHAT TO REMEMBER °· Keep up with your usual exercises and follow other instructions given by your health care provider.   °· Take medicines as directed by your health care provider.   °· Keep your regular prenatal appointments.   °· Eat and drink lightly if you think you are going into labor.   °· If Braxton Hicks contractions are making you uncomfortable:   °¨ Change your position from lying down or resting to walking, or from walking to resting.   °¨ Sit and rest in a tub of warm water.   °¨ Drink 2-3 glasses of water. Dehydration may cause these contractions.   °¨ Do slow and deep breathing several times an hour.   °WHEN SHOULD I SEEK IMMEDIATE MEDICAL CARE? °Seek immediate medical care if: °· Your contractions become stronger, more regular, and closer together.   °· You have fluid leaking or gushing from your vagina.   °· You have a fever.    °· You have vaginal bleeding.   °· You have continuous abdominal pain.   °· You have low back pain that you never had before.   °· You feel your baby's head pushing down and causing pelvic pressure.   °· Your baby is not moving as much as it used to.   °Document Released: 02/11/2005 Document Revised: 02/16/2013 Document Reviewed: 11/23/2012 °[employer]® Patient Information ©2015 [employer]. This information is not intended to replace advice given to you by your health care provider. Make sure you discuss any   questions you have with your health care provider. ° °

## 2013-12-26 NOTE — MAU Note (Signed)
uc's since last night, started light bleeding approx an hour ago, denies LOF.  Reports good FM.

## 2013-12-27 ENCOUNTER — Inpatient Hospital Stay (HOSPITAL_COMMUNITY)
Admission: AD | Admit: 2013-12-27 | Discharge: 2013-12-28 | DRG: 774 | Disposition: A | Discharge status: Home / Self Care | Payer: Medicaid Other | Source: Ambulatory Visit | Attending: Obstetrics & Gynecology | Admitting: Obstetrics & Gynecology

## 2013-12-27 ENCOUNTER — Encounter (HOSPITAL_COMMUNITY): Payer: Self-pay

## 2013-12-27 DIAGNOSIS — Z8611 Personal history of tuberculosis: Secondary | ICD-10-CM

## 2013-12-27 DIAGNOSIS — O9852 Other viral diseases complicating childbirth: Secondary | ICD-10-CM

## 2013-12-27 DIAGNOSIS — O0933 Supervision of pregnancy with insufficient antenatal care, third trimester: Secondary | ICD-10-CM

## 2013-12-27 DIAGNOSIS — Z3A39 39 weeks gestation of pregnancy: Secondary | ICD-10-CM | POA: Diagnosis present

## 2013-12-27 LAB — CBC
HEMATOCRIT: 35 % — AB (ref 36.0–46.0)
Hemoglobin: 12.1 g/dL (ref 12.0–15.0)
MCH: 30.9 pg (ref 26.0–34.0)
MCHC: 34.6 g/dL (ref 30.0–36.0)
MCV: 89.5 fL (ref 78.0–100.0)
Platelets: 182 10*3/uL (ref 150–400)
RBC: 3.91 MIL/uL (ref 3.87–5.11)
RDW: 14.1 % (ref 11.5–15.5)
WBC: 14.1 10*3/uL — AB (ref 4.0–10.5)

## 2013-12-27 LAB — RAPID HIV SCREEN (WH-MAU): SUDS RAPID HIV SCREEN: NONREACTIVE

## 2013-12-27 LAB — RPR

## 2013-12-27 MED ORDER — DIBUCAINE 1 % RE OINT: 1.0000 "application " | TOPICAL_OINTMENT | RECTAL | Status: DC | PRN

## 2013-12-27 MED ORDER — NALBUPHINE HCL 10 MG/ML IJ SOLN
5.0000 mg | INTRAMUSCULAR | Status: DC | PRN
  Administered 2013-12-27: 5 mg via INTRAVENOUS
  Filled 2013-12-27: qty 1

## 2013-12-27 MED ORDER — PRENATAL MULTIVITAMIN CH
1.0000 | ORAL_TABLET | Freq: Every day | ORAL | Status: DC
  Administered 2013-12-28: 1 via ORAL
  Filled 2013-12-27: qty 1

## 2013-12-27 MED ORDER — SIMETHICONE 80 MG PO CHEW: 80.0000 mg | CHEWABLE_TABLET | ORAL | Status: DC | PRN

## 2013-12-27 MED ORDER — DIPHENHYDRAMINE HCL 25 MG PO CAPS: 25.0000 mg | ORAL_CAPSULE | Freq: Four times a day (QID) | ORAL | Status: DC | PRN

## 2013-12-27 MED ORDER — OXYTOCIN 40 UNITS IN LACTATED RINGERS INFUSION - SIMPLE MED
62.5000 mL/h | INTRAVENOUS | Status: DC
  Administered 2013-12-27: 62.5 mL/h via INTRAVENOUS
  Filled 2013-12-27: qty 1000

## 2013-12-27 MED ORDER — ZOLPIDEM TARTRATE 5 MG PO TABS: 5.0000 mg | ORAL_TABLET | Freq: Every evening | ORAL | Status: DC | PRN

## 2013-12-27 MED ORDER — ONDANSETRON HCL 4 MG/2ML IJ SOLN: 4.0000 mg | Freq: Four times a day (QID) | INTRAMUSCULAR | Status: DC | PRN

## 2013-12-27 MED ORDER — BENZOCAINE-MENTHOL 20-0.5 % EX AERO: 1.0000 "application " | INHALATION_SPRAY | CUTANEOUS | Status: DC | PRN

## 2013-12-27 MED ORDER — OXYCODONE-ACETAMINOPHEN 5-325 MG PO TABS: 1.0000 | ORAL_TABLET | ORAL | Status: DC | PRN

## 2013-12-27 MED ORDER — OXYTOCIN BOLUS FROM INFUSION: 500.0000 mL | INTRAVENOUS | Status: DC

## 2013-12-27 MED ORDER — LACTATED RINGERS IV SOLN: 500.0000 mL | INTRAVENOUS | Status: DC | PRN

## 2013-12-27 MED ORDER — WITCH HAZEL-GLYCERIN EX PADS: 1.0000 "application " | MEDICATED_PAD | CUTANEOUS | Status: DC | PRN

## 2013-12-27 MED ORDER — CITRIC ACID-SODIUM CITRATE 334-500 MG/5ML PO SOLN: 30.0000 mL | ORAL | Status: DC | PRN

## 2013-12-27 MED ORDER — ONDANSETRON HCL 4 MG PO TABS: 4.0000 mg | ORAL_TABLET | ORAL | Status: DC | PRN

## 2013-12-27 MED ORDER — LANOLIN HYDROUS EX OINT: TOPICAL_OINTMENT | CUTANEOUS | Status: DC | PRN

## 2013-12-27 MED ORDER — IBUPROFEN 600 MG PO TABS
600.0000 mg | ORAL_TABLET | Freq: Four times a day (QID) | ORAL | Status: DC
  Administered 2013-12-27 – 2013-12-28 (×5): 600 mg via ORAL
  Filled 2013-12-27 (×5): qty 1

## 2013-12-27 MED ORDER — OXYCODONE-ACETAMINOPHEN 5-325 MG PO TABS: 2.0000 | ORAL_TABLET | ORAL | Status: DC | PRN

## 2013-12-27 MED ORDER — TETANUS-DIPHTH-ACELL PERTUSSIS 5-2.5-18.5 LF-MCG/0.5 IM SUSP
0.5000 mL | Freq: Once | INTRAMUSCULAR | Status: AC
  Administered 2013-12-27: 0.5 mL via INTRAMUSCULAR

## 2013-12-27 MED ORDER — ONDANSETRON HCL 4 MG/2ML IJ SOLN: 4.0000 mg | INTRAMUSCULAR | Status: DC | PRN

## 2013-12-27 MED ORDER — ACETAMINOPHEN 325 MG PO TABS: 650.0000 mg | ORAL_TABLET | ORAL | Status: DC | PRN

## 2013-12-27 MED ORDER — LIDOCAINE HCL (PF) 1 % IJ SOLN
30.0000 mL | INTRAMUSCULAR | Status: DC | PRN
  Filled 2013-12-27: qty 30

## 2013-12-27 MED ORDER — LACTATED RINGERS IV SOLN
INTRAVENOUS | Status: DC
  Administered 2013-12-27: 08:00:00 via INTRAVENOUS

## 2013-12-27 MED ORDER — SENNOSIDES-DOCUSATE SODIUM 8.6-50 MG PO TABS
2.0000 | ORAL_TABLET | ORAL | Status: DC
  Administered 2013-12-27: 2 via ORAL
  Filled 2013-12-27: qty 2

## 2013-12-27 NOTE — H&P (Signed)
Deborah Chan is a 28 y.o. female G2P1001 with IUP at 2878w1d presenting for active labor. Pt states she has been having regular, every 5-10 minutes contractions since last night associated with spotting vaginal bleeding.  Membranes are intact, with active fetal movement.   PNCare at [facility] since 30 wks  Prenatal History/Complications: Late prenatal care History of TB Rubella Non-immune Language Barrier - Speaks Nepali  Past Medical History: Past Medical History  Diagnosis Date  . Swallowing difficulty   . Positive PPD, treated     Past Surgical History: Past Surgical History  Procedure Laterality Date  . 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 Marital Status: Married    Spouse Name: N/A    Number of Children: N/A  . Years of Education: N/A   Social History Main Topics  . Smoking status: Never Smoker   . Smokeless tobacco: Never Used  . Alcohol Use: No  . Drug Use: No  . Sexual Activity: Yes   Other Topics Concern  . None   Social History Narrative    Family History: Family History  Problem Relation Age of Onset  . Other Neg Hx     Allergies: No Known Allergies  Prescriptions prior to admission  Medication Sig Dispense Refill Last Dose  . Prenatal Vit-Fe Fumarate-FA (PRENATAL MULTIVITAMIN) TABS Take 1 tablet by mouth daily.   12/25/2013 at Unknown time     Review of Systems   Constitutional- No fevers or chills.   Blood pressure 119/74, pulse 78, temperature 98.2 F (36.8 C), temperature source Oral, resp. rate 18, height 5' (1.524 m), weight 57.607 kg (127 lb), not currently breastfeeding. General appearance: alert, cooperative and mild distress Lungs: clear to auscultation bilaterally Heart: regular rate and rhythm Abdomen: soft,  non-tender; bowel sounds normal Extremities: Homans sign is negative, no sign of DVT DTR's wnl Presentation: cephalic Fetal monitoringBaseline: 130\ bpm, Variability: Good {> 6 bpm), Accelerations: Reactive and Decelerations: Absent Uterine activityFrequency: Every 2-6 minutes Dilation: 7.5 Effacement (%): 90 Station: 0 Exam by:: L Brewer RNC   Prenatal labs: ABO, Rh:   Antibody:   Rubella:   RPR: Nonreactive (09/05 0000)  HBsAg:    HIV: Non-reactive (08/31 0000)  GBS: Negative (10/16 0000)  1 hr Glucola 90 Genetic screening  Too late Anatomy US Limited views   Prenatal Transfer Tool  Maternal Diabetes: No Genetic Screening: Presented too late Maternal Ultrasounds/Referrals: Normal Fetal Ultrasounds or other Referrals:  None Maternal Substance Abuse:  No Significant Maternal Medications:  None Significant Maternal Lab Results: Lab values include: Other:  Rubella non-immune     Results for orders placed or performed during the hospital encounter of 12/27/13 (from the past 24 hour(s))  Rapid HIV screen   Collection Time: 12/27/13  8:05 AM  Result Value Ref Range   SUDS Rapid HIV Screen NON REACTIVE NON REACTIVE  CBC   Collection Time: 12/27/13  8:05 AM  Result Value Ref Range   WBC 14.1 (H) 4.0 - 10.5 K/uL   RBC 3.91 3.87 - 5.11 MIL/uL   Hemoglobin 12.1 12.0 - 15.0 g/dL   HCT 16.135.0 (L) 09.636.0 - 04.546.0 %   MCV  89.5 78.0 - 100.0 fL   MCH 30.9 26.0 - 34.0 pg   MCHC 34.6 30.0 - 36.0 g/dL   RDW 40.914.1 81.111.5 - 91.415.5 %   Platelets 182 150 - 400 K/uL    Assessment: Deborah Chan is a 28 y.o. G2P1001 at 6166w1d here with active labor #Labor:SOL, expectant management #Pain: Analgesia and anesthesia prn #FWB: Category 1 #ID:  GBS negative #MOF: Breast/Bottle #MOC:Nexplanon #Circ:  N/a, female baby  [name], Caleb, MD 12/27/2013, 9:58 AM   Evaluation and management procedures were performed by Resident physician under my supervision/collaboration. Chart reviewed, patient  examined by me and I agree with management and plan. Danae Orleanseirdre C Sheril Hammond, CNM 12/27/2013 11:28 AM  AROM at 1125 by Dr. Jimmey RalphParker> clear AF, cx lip, FHR stable Danae OrleansDeirdre C Jaliel Deavers, CNM 12/27/2013 11:29 AM

## 2013-12-27 NOTE — Plan of Care (Signed)
Problem: Consults Goal: Postpartum Patient Education (See Patient Education module for education specifics.)  Outcome: Completed/Met Date Met:  12/27/13  Problem: Phase I Progression Outcomes Goal: Pain controlled with appropriate interventions Outcome: Completed/Met Date Met:  12/27/13 Goal: Voiding adequately Outcome: Completed/Met Date Met:  12/27/13 Goal: OOB as tolerated unless otherwise ordered Outcome: Completed/Met Date Met:  12/27/13 Goal: VS, stable, temp < 100.4 degrees F Outcome: Completed/Met Date Met:  12/27/13 Goal: Initial discharge plan identified Outcome: Completed/Met Date Met:  12/27/13 Goal: Other Phase I Outcomes/Goals Outcome: Completed/Met Date Met:  12/27/13  Problem: Phase II Progression Outcomes Goal: Pain controlled on oral analgesia Outcome: Completed/Met Date Met:  12/27/13 Goal: Progress activity as tolerated unless otherwise ordered Outcome: Completed/Met Date Met:  12/27/13 Goal: Afebrile, VS remain stable Outcome: Completed/Met Date Met:  12/27/13 Goal: Tolerating diet Outcome: Completed/Met Date Met:  12/27/13 Goal: Other Phase II Outcomes/Goals Outcome: Completed/Met Date Met:  12/27/13     

## 2013-12-27 NOTE — Progress Notes (Signed)
HHogan, CNM notified of pt cervical exam, orders rcvd, gbs negative.

## 2013-12-27 NOTE — Lactation Note (Signed)
This note was copied from the chart of [name]. Lactation Consultation Note  Patient Name: [name] Today's Date: 12/27/2013 Reason for consult: Initial assessment of this mom and baby, now 8 hours postpartum.Mom nursed first child for 6 months but parents state that her milk did not "come in" for several weeks with first child.  However, they supplemented early on with that child and have been "attempting" to breastfeed then giving formula per choice.  LC reviewed importance of frequent STS, cue feedings and avoiding formula until breastfeeding is established (at least 2 weeks)  LC reviewed LEAD cautions and supply and demand process of milk production.  LC demonstrated hand expression and a few glistening drops were visible.  Mom has firm breasts and everted nipples. Baby asleep and was fed formula 2 hours ago.  LC encouraged mom to call for nurse or LC to assist at next feeding and discussed assessment with RN, Deborah Chan.  LC encouraged review of Baby and Me pp 9, 14 and 20-25 for STS and BF information. LC provided [employer] Resource brochure and reviewed [facility] services and list of community and web site resources.      Maternal Data Formula Feeding for Exclusion: Yes Reason for exclusion: Mother's choice to formula and breast feed on admission Has patient been taught Hand Expression?: Yes (LC demonstrated hand expression and glistening drops visible) Does the patient have breastfeeding experience prior to this delivery?: Yes  Feeding Feeding Type: Formula Nipple Type: Slow - flow  LATCH Score/Interventions              no latch yet; mom has fed formula (bottle) x3 since delivery        Lactation Tools Discussed/Used   STS, cue feedings, hand expression Supply and demand and LEAD cautions regarding early supplementation  Consult Status Date: 12/28/13 Follow-up type: In-patient    Deborah Chan, Deborah Chan 12/27/2013, 8:32 PM

## 2013-12-27 NOTE — MAU Note (Signed)
Pt presents to MAU with complaints of contractions throughout the night with small amount of bleeding when she wipes.

## 2013-12-27 NOTE — Plan of Care (Signed)
Problem: Consults Goal: Automotive engineer Patient Education (See Patient Education module for education specifics.)  Outcome: Completed/Met Date Met:  12/27/13 Goal: Birthing Suites Patient Information Press F2 to bring up selections list  Outcome: Completed/Met Date Met:  12/27/13  Pt 37-[redacted] weeks EGA Goal: Skin Care Protocol Initiated - if Braden Score 18 or less If consults are not indicated, leave blank or document N/A  Outcome: Not Applicable Date Met:  41/74/08 Goal: Prenatal labs/testing reviewed upon admission Outcome: Completed/Met Date Met:  12/27/13 Goal: Orientation to unit: Plan of Care Outcome: Completed/Met Date Met:  12/27/13 Goal: Orientation to unit: Room Outcome: Completed/Met Date Met:  12/27/13 Goal: Orientation to unit: [place] (smoking, visitation, chaplain services, helpline)  Outcome: Completed/Met Date Met:  12/27/13 Goal: Orientation to unit: Other (Specify with a note) Outcome: Completed/Met Date Met:  12/27/13  Problem: Phase I Progression Outcomes Goal: Assess per MD/Nurse,Routine-VS,FHR,UC,Head to Toe assess Outcome: Completed/Met Date Met:  12/27/13 Goal: Obtain and review prenatal records Outcome: Completed/Met Date Met:  12/27/13 Goal: Pain controlled with appropriate interventions Outcome: Progressing Patient received IV pain medication  Goal: OOB as tolerated unless otherwise ordered Outcome: Completed/Met Date Met:  12/27/13 Goal: Tolerating diet Outcome: Progressing Goal: Adequate progression of labor Outcome: Progressing Goal: Medications/IV Fluids N/A Outcome: Completed/Met Date Met:  12/27/13 Goal: Induction meds as ordered Outcome: Not Applicable Date Met:  14/48/18 Goal: IV Pain medications as ordered Outcome: Completed/Met Date Met:  12/27/13

## 2013-12-27 NOTE — MAU Note (Signed)
Pt states ctx's q2-3 minutes apart. Denies lof. Does have bloody show. Was 3.5cm yesterday in MAU.

## 2013-12-28 MED ORDER — DOCUSATE SODIUM 100 MG PO CAPS: 100.0000 mg | ORAL_CAPSULE | Freq: Two times a day (BID) | ORAL | Status: AC | PRN

## 2013-12-28 MED ORDER — IBUPROFEN 600 MG PO TABS: 600.0000 mg | ORAL_TABLET | Freq: Four times a day (QID) | ORAL | Status: AC

## 2013-12-28 NOTE — Lactation Note (Addendum)
This note was copied from the chart of Deborah Chan. Lactation Consultation Note; Mom reports that baby has been nursing well with no pain. Has been giving bottles of formula because baby is still hungry after nursing. Encouraged to always BF first to promote a good milk supply. Discussed supply and demand. Baby asleep in bassinet at present. No questions at present. To call prn  Patient Name: Deborah Chan ZOXWR'UToday's Date: 12/28/2013 Reason for consult: Follow-up assessment   Maternal Data Formula Feeding for Exclusion: Yes Reason for exclusion: Mother's choice to formula and breast feed on admission  Feeding   LATCH Score/Interventions                      Lactation Tools Discussed/Used     Consult Status Consult Status: PRN    Pamelia HoitWeeks, Paulmichael Schreck D 12/28/2013, 2:40 PM

## 2013-12-28 NOTE — Discharge Summary (Signed)
Obstetric Discharge Summary Reason for Admission: onset of labor Prenatal Procedures: none Intrapartum Procedures: spontaneous vaginal delivery Postpartum Procedures: none Complications-Operative and Postpartum: 1st degree perineal laceration   28 y/o G2P2 at 5453w1d presented in labor went on to a NSVD with local anesthesia. 3 vessel cord spontaneous placenta EBL: 300 mL. 1st degree laceration repaired. Apgar's 8 and 9. Mom is breast and bottle feeding. Wants Nexplanon for contraception.   HEMOGLOBIN  Date Value Ref Range Status  12/27/2013 12.1 12.0 - 15.0 g/dL Final   HCT  Date Value Ref Range Status  12/27/2013 35.0* 36.0 - 46.0 % Final    Physical Exam:  General: alert and cooperative  Cardiovascular: RRR. No murmurs, rubs, or gallops.  Chest: Normal effort. Lungs CTA bilaterally.  Lochia: appropriate Uterine Fundus: firm DVT Evaluation: No evidence of DVT seen on physical exam.  Discharge Diagnoses: Term Pregnancy-delivered  Discharge Information: Date: 12/28/2013 Activity: pelvic rest Diet: routine Medications: Ibuprofen Condition: stable Instructions: refer to practice specific booklet Discharge to: home   Newborn Data: Live born female  Birth Weight: 6 lb 1.5 oz (2764 g) APGAR: 8, 9  Home with mother.  Deborah Chan, Tayla 12/28/2013, 7:46 AM   I have seen this patient and agree with the above PA student's note.  LEFTWICH-KIRBY, Breylon Sherrow Certified Nurse-Midwife

## 2013-12-28 NOTE — Progress Notes (Signed)
Clinical Social Work Department PSYCHOSOCIAL ASSESSMENT - MATERNAL/CHILD 2013-11-09  Patient:  Deborah Chan  Account Number:  [id number]  Admit Date:  Feb 10, 2014  Ardine Eng Name:   Katha Hamming   Clinical Social Worker:  Lucita Ferrara, CLINICAL SOCIAL WORKER   Date/Time:  2013/05/25 01:00 PM  Date Referred:  March 26, 2013   Referral source  Central Nursery     Referred reason  [facility]   Other referral source:    I:  FAMILY / HOME ENVIRONMENT Child's legal guardian:  PARENT  Guardian - Name Guardian - Age Guardian - Address  [address] [place], [place] [address]  [place]  same as above   Other household support members/support persons Name Relationship DOB   DAUGHTER 2013   Other support:   MOB stated she lives with her mother, father, and siblings, along with the FOB and their older daugther.  The MOB shared belief that they have numerous family members who are supportive.    II  PSYCHOSOCIAL DATA Information Source:  Patient Interview  Occupational hygienist Employment:   MOB stated that both she and the FOB work within the [employer].   Financial resources:  Multimedia programmer If [place]:  [place] Other  Joy / Grade:  N/A Music therapist / Child Services Coordination / Early Interventions:   None reported  Cultural issues impacting care:   MOB moved from [place] to the [place] in 2012.   III  STRENGTHS Strengths  Adequate Resources  Home prepared for Child (including basic supplies)  Supportive family/friends   Strength comment:    IV  RISK FACTORS AND CURRENT PROBLEMS Current Problem:  YES   Risk Factor & Current Problem Patient Issue Family Issue Risk Factor / Current Problem Comment  Other - See comment N N MOB arrived for first prenatal appointment at 53 weeks.  Baby's UDS is negative.    V  SOCIAL WORK ASSESSMENT CSW met with the MOB in her room in order to complete the assessment.  Consult was ordered due to late prenatal care as the MOB presented for first prenatal appointment at 47 weeks.  MOB provided consent for her sister to be present for the assessment.  MOB was easily engaged and was receptive to the visit.  She displayed an appropriate range in affect and presented in an euthymic mood.    MOB expressed excitement upon the birth of her baby.  She stated that she is looking forward to returning home so that her oldest daughter can meet the baby.  MOB shared that she is well supported by her family, and discussed that she lives with her extended family.  She shared belief that help is readily available if needed.  MOB stated that she intends to return to her previous place of employment once she recovers from the delivery.  MOB denied any questions, concerns, or presented of any acute stressors that may negatively impact her transition into the postpartum period.    MOB denied mental health history and denied history of postpartum depression.  MOB did express anxiety during her pregnancy since she was concerned about her ability to emotionally provide for two children, but she denied any excessive worry that negatively impacted her behaviors and her ability to function at home and at school.  MOB acknowledged education on postpartum depression and agreed to contact her MD if she experiences signs or symptoms.   MOB acknowledged late prenatal care.  She  reported barriers to accessing care due to inflexible work schedule and difficulties securing Medicaid.  MOB denied any barriers to accessing medical care in the postpartum period, and shared belief that she will be able to attend all follow-up appointments for the baby.  MOB acknowledged hospital drug screen policy, and denied any substance use during her pregnancy.   No barriers to discharge.   VI SOCIAL WORK PLAN Social Work Therapist, art  No Further Intervention Required / No Barriers to Discharge   Type  of pt/family education:   Postpartum depression  Hospital drug screen policy   If child protective services report - county:   If child protective services report - date:   Information/referral to community resources comment:   No referrals needed at this time.   Other social work plan:   CSW to monitor meconium drug screen and will CPS report if needed.  CSW to follow-up PRN.

## 2013-12-28 NOTE — Discharge Instructions (Signed)

## 2013-12-29 NOTE — Progress Notes (Signed)
Post discharge ur review completed. 

## 2015-12-20 IMAGING — US US OB COMP +14 WK
1 series · 12 of 28 positions shown · non-contrast
Comparison: none

[Series 1: us ob comp +14 wk mfm · 119 acquisitions, 12 frames shown]
[im 5/119]
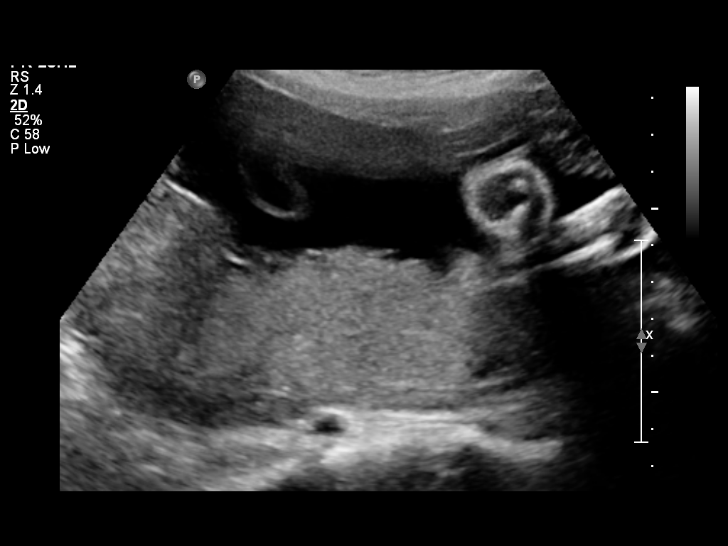
[im 14/119]
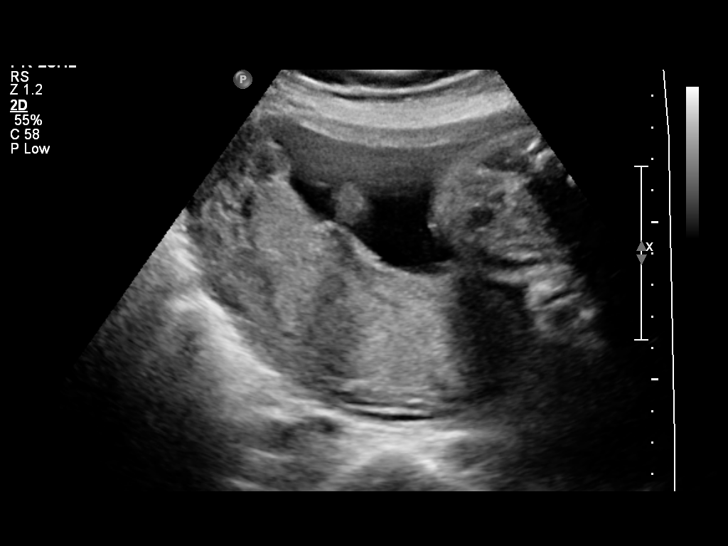
[im 22/119]
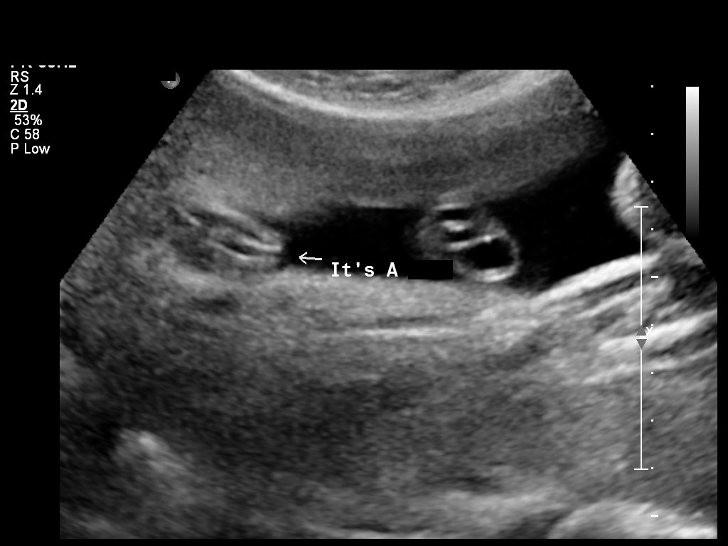
[im 35/119]
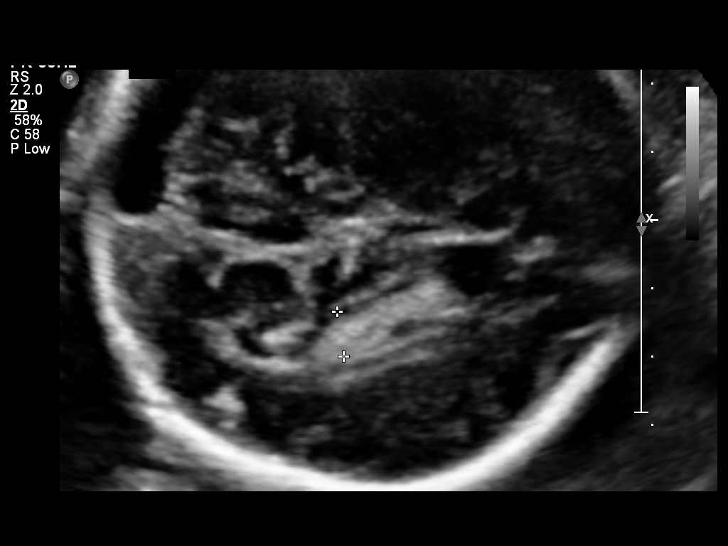
[im 44/119]
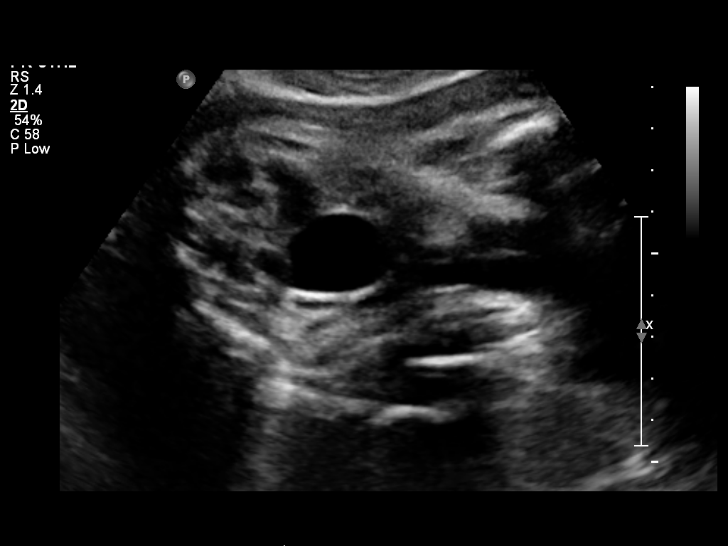
[im 53/119]
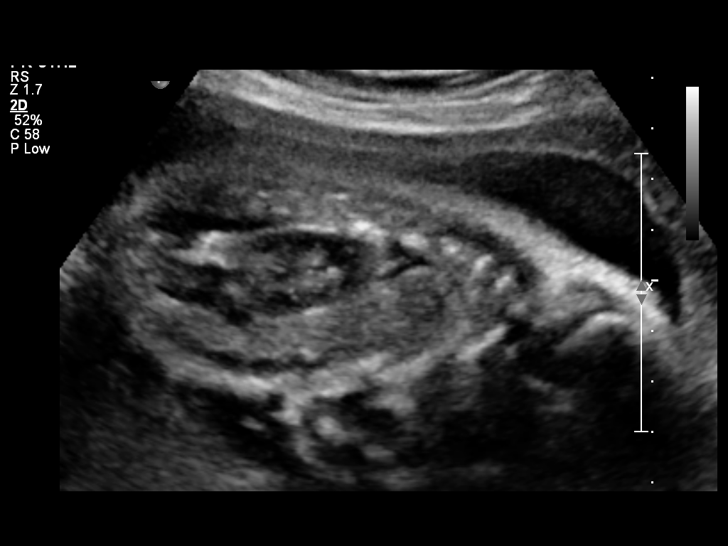
[im 66/119]
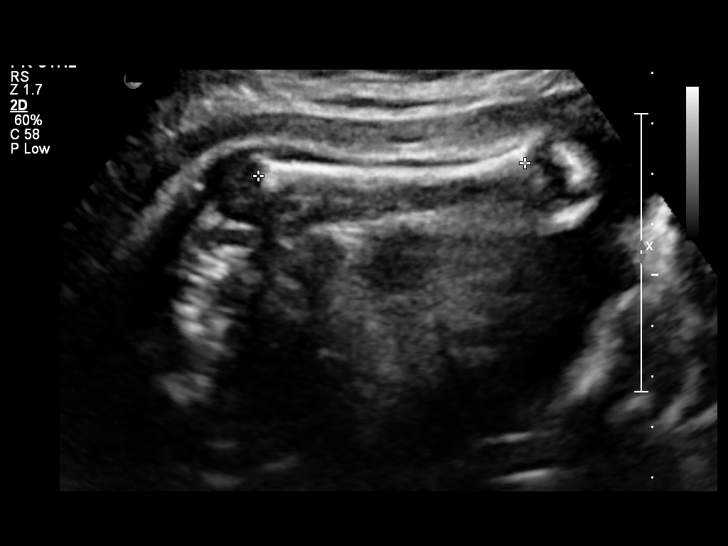
[im 75/119]
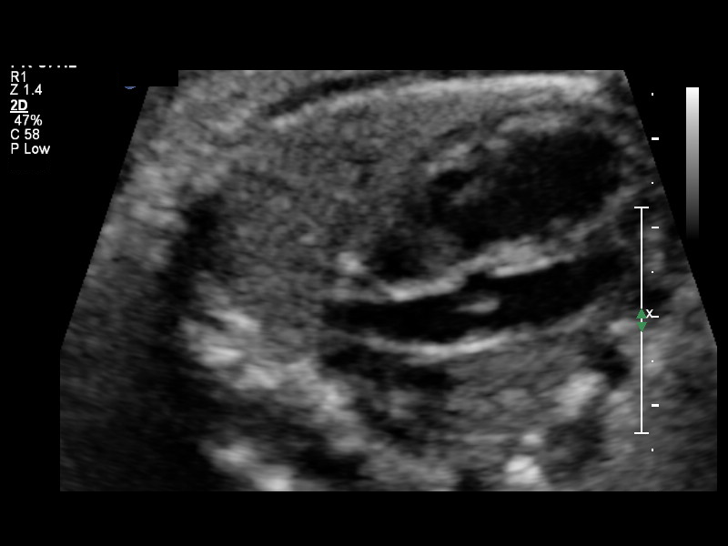
[im 84/119]
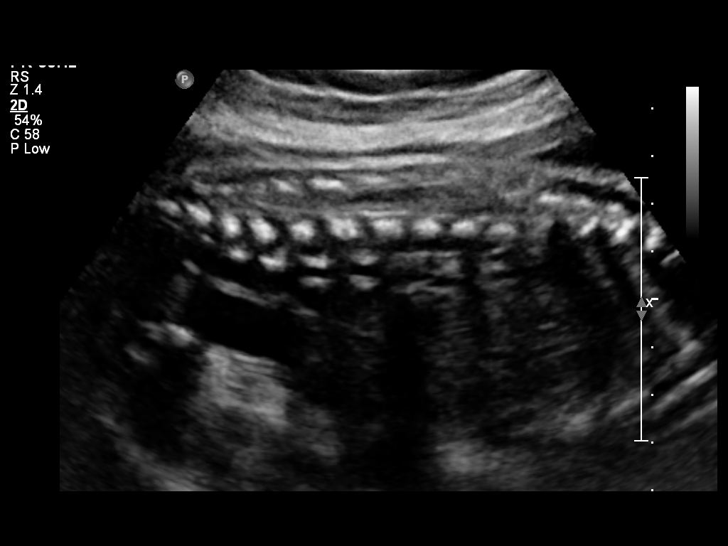
[im 97/119]
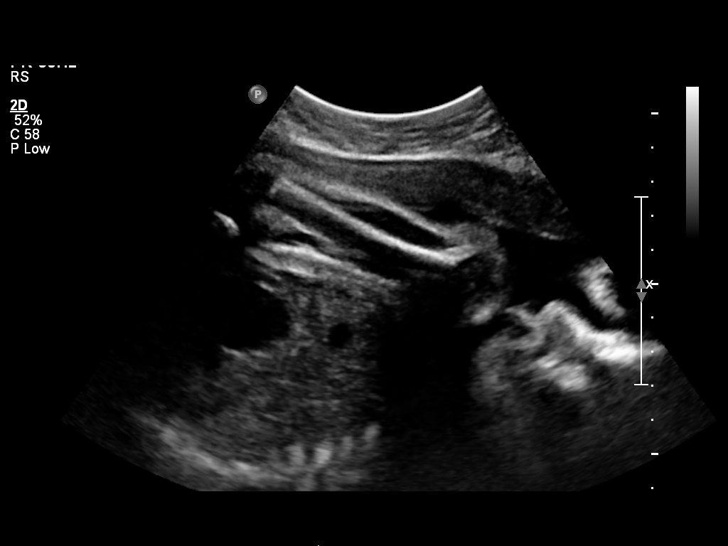
[im 105/119]
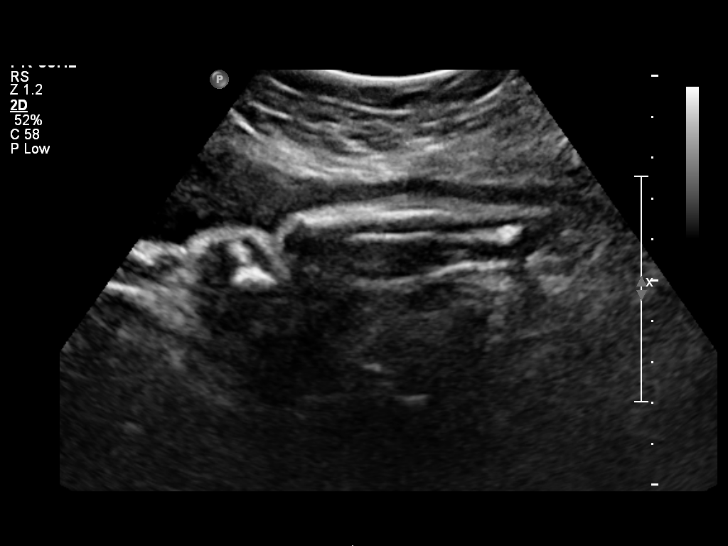
[im 114/119]
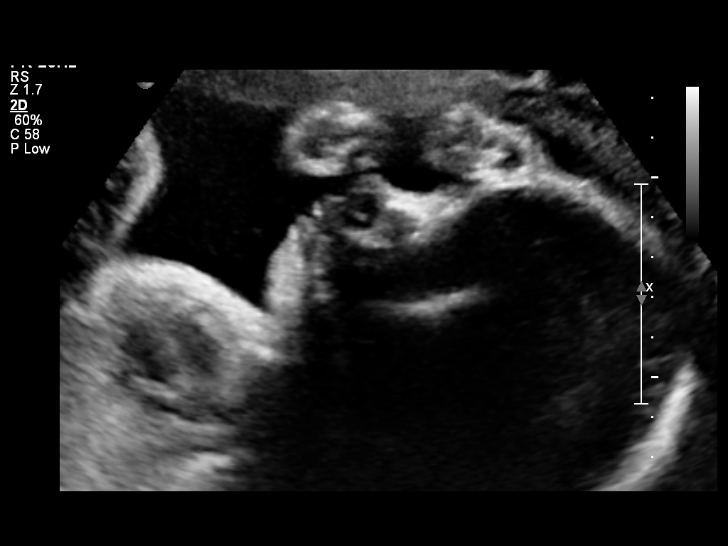

[12 of 28 positions shown; findings below may reference images not displayed]

OBSTETRICS REPORT
                      (Signed Final 10/27/2013 [DATE])

Service(s) Provided

 US OB COMP + 14 WK                                    76805.1
Indications

 Basic anatomic survey
 Detailed fetal anatomic survey
Fetal Evaluation

 Num Of Fetuses:    1
 Fetal Heart Rate:  149                          bpm
 Cardiac Activity:  Observed
 Presentation:      Cephalic
 Placenta:          Posterior, above cervical
                    os
 P. Cord            Visualized, central
 Insertion:

 Amniotic Fluid
 AFI FV:      Subjectively within normal limits
 AFI Sum:     10.17   cm       15  %Tile     Larg Pckt:    3.53  cm
 RUQ:   3.53    cm   RLQ:    1.4    cm    LUQ:   3.27    cm   LLQ:    1.97   cm
Biometry

 BPD:     75.9  mm     G. Age:  30w 3d                CI:        75.98   70 - 86
                                                      FL/HC:      21.3   19.2 -

 HC:       276  mm     G. Age:  30w 1d       12  %    HC/AC:      1.10   0.99 -

 AC:     250.9  mm     G. Age:  29w 2d       17  %    FL/BPD:     77.5   71 - 87
 FL:      58.8  mm     G. Age:  30w 5d       43  %    FL/AC:      23.4   20 - 24
 HUM:     52.8  mm     G. Age:  30w 5d       59  %

 Est. FW:    7283  gm      3 lb 4 oz     44  %
Gestational Age

 LMP:           30w 3d        Date:  03/28/13                 EDD:   01/02/14
 U/S Today:     30w 1d                                        EDD:   01/04/14
 Best:          30w 3d     Det. By:  LMP  (03/28/13)          EDD:   01/02/14
Anatomy
 Cranium:          Appears normal         Aortic Arch:      Not well visualized
 Fetal Cavum:      Appears normal         Ductal Arch:      Not well visualized
 Ventricles:       Appears normal         Diaphragm:        Appears normal
 Choroid Plexus:   Appears normal         Stomach:          Appears normal, left
                                                            sided
 Cerebellum:       Appears normal         Abdomen:          Appears normal
 Posterior Fossa:  Appears normal         Abdominal Wall:   Not well visualized
 Nuchal Fold:      Not applicable (>20    Cord Vessels:     Appears normal (3
                   wks GA)                                  vessel cord)
 Face:             Appears normal         Kidneys:          Appear normal
                   (orbits and profile)
 Lips:             Not well visualized    Bladder:          Appears normal
 Heart:            Appears normal         Spine:            Appears normal
                   (4CH, axis, and
                   situs)
 RVOT:             Appears normal         Lower             Appears normal
                                          Extremities:
 LVOT:             Appears normal         Upper             Appears normal
                                          Extremities:

 Other:  Fetus appears to be a female. Nasal bone visualized. Technically
         difficult due to advanced gestational age.
Targeted Anatomy

 Fetal Central Nervous System
 Cisterna Magna:
Cervix Uterus Adnexa

 Cervix:       Not visualized (advanced GA >82wks)
 Uterus:       No abnormality visualized.
 Left Ovary:    Not visualized.
 Right Ovary:   Not visualized.

 Adnexa:     No adnexal mass visualized.
Impression

 SIUP at 30+3 weeks
 Normal detailed fetal anatomy; limited views of arches and
 upper lip
 Normal amniotic fluid volume
 Measurements consistent with LMP dating
Recommendations

 Follow-up as clinically indicated

 Thank you for sharing in the care of Ms. LIO GELB with
 questions or concerns.

## 2016-02-02 IMAGING — US US OB FOLLOW-UP
1 series · 12 of 28 positions shown · non-contrast
Comparison: none

[Series 1: us ob follow up · 12 of 40 slices shown]
[im 2/40]
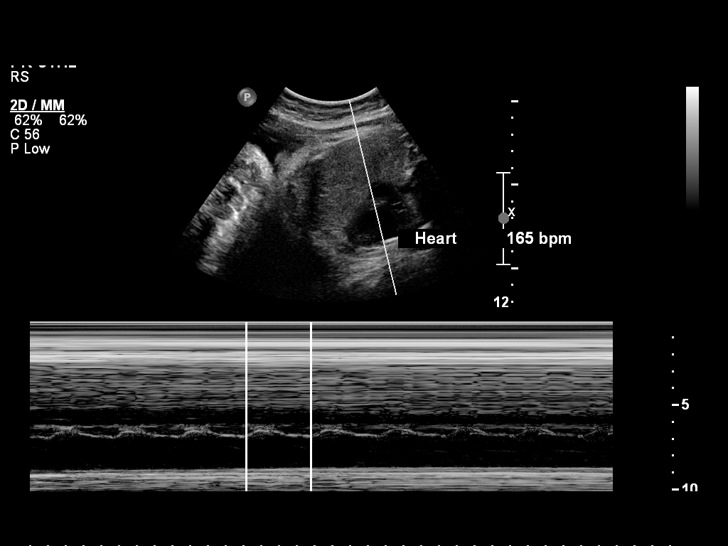
[im 5/40]
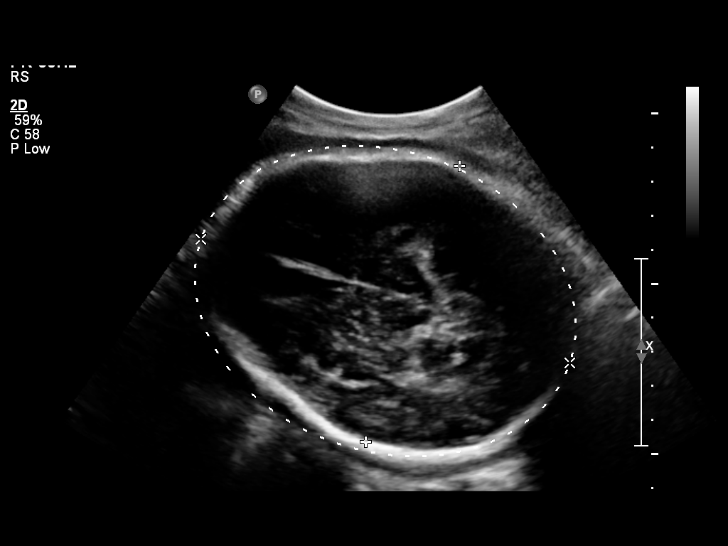
[im 8/40]
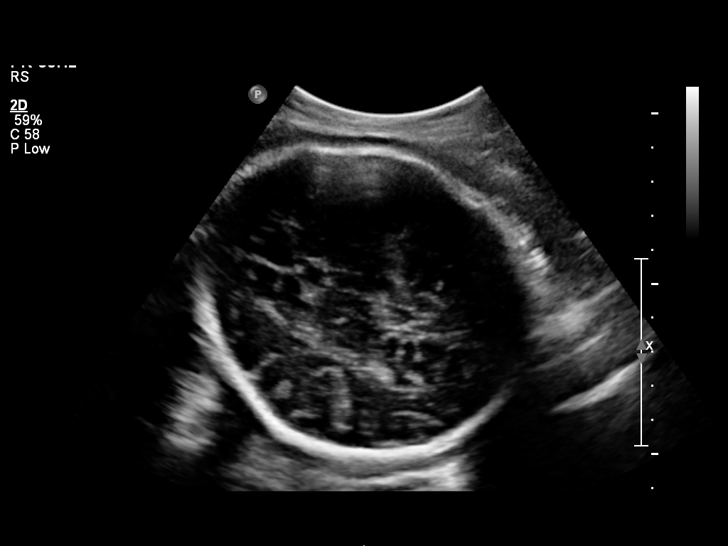
[im 12/40]
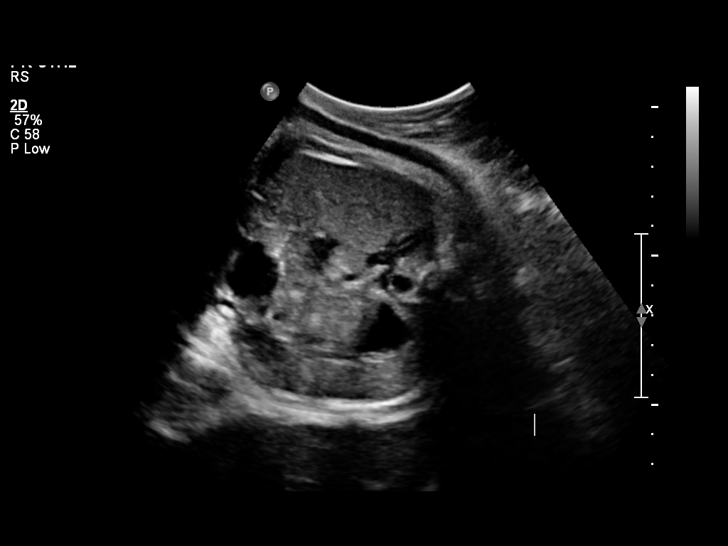
[im 15/40]
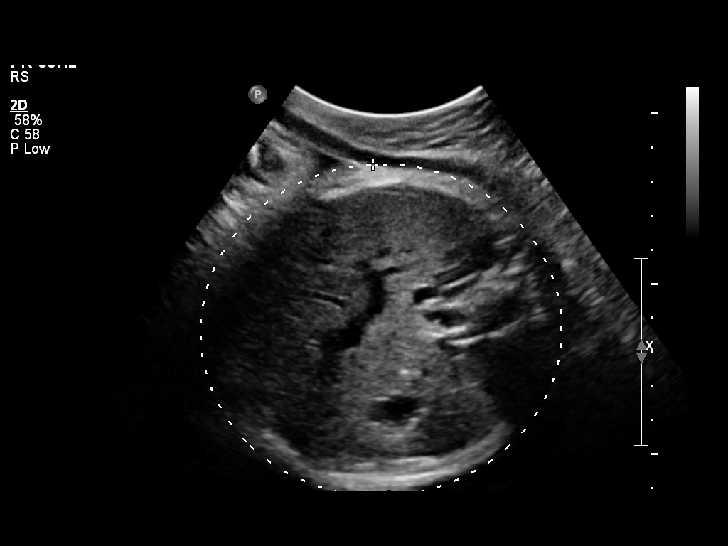
[im 18/40]
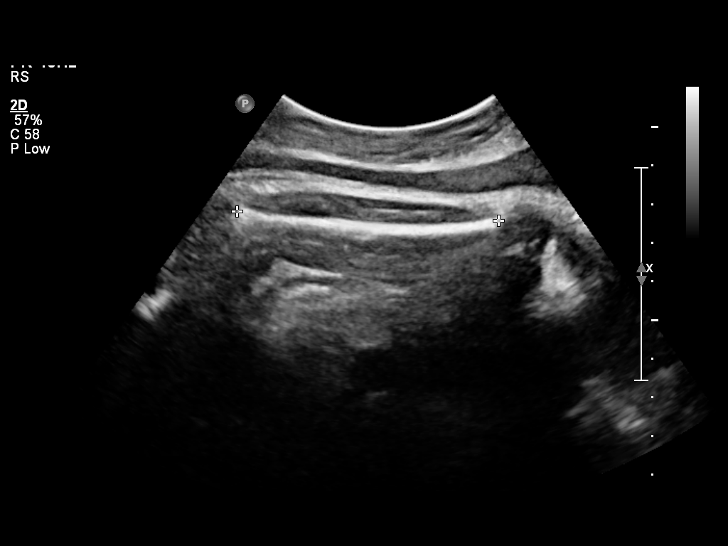
[im 22/40]
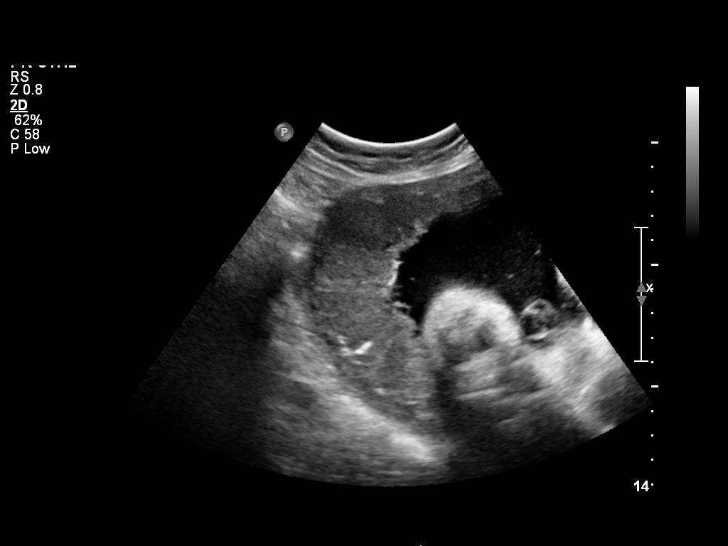
[im 25/40]
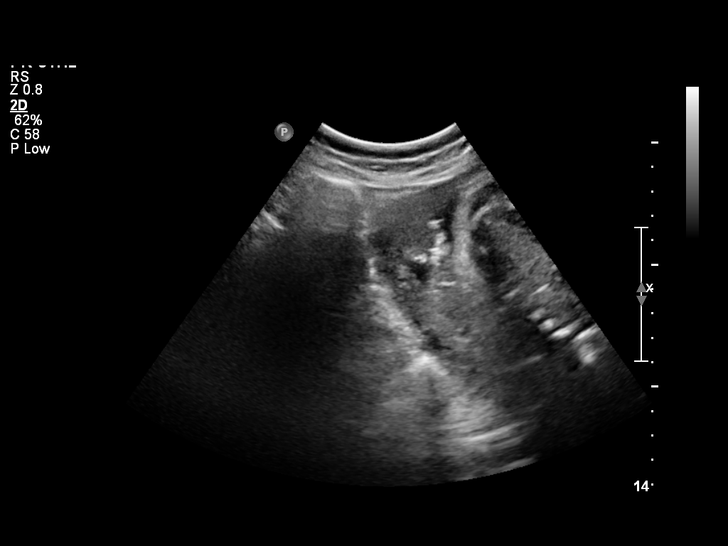
[im 28/40]
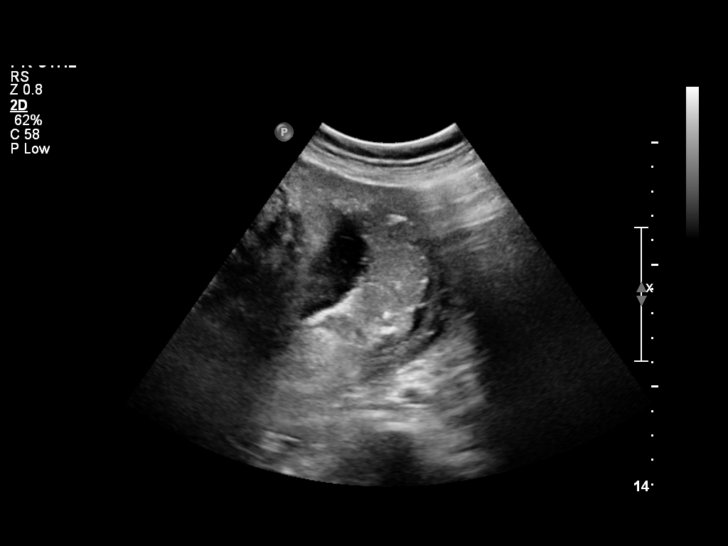
[im 32/40]
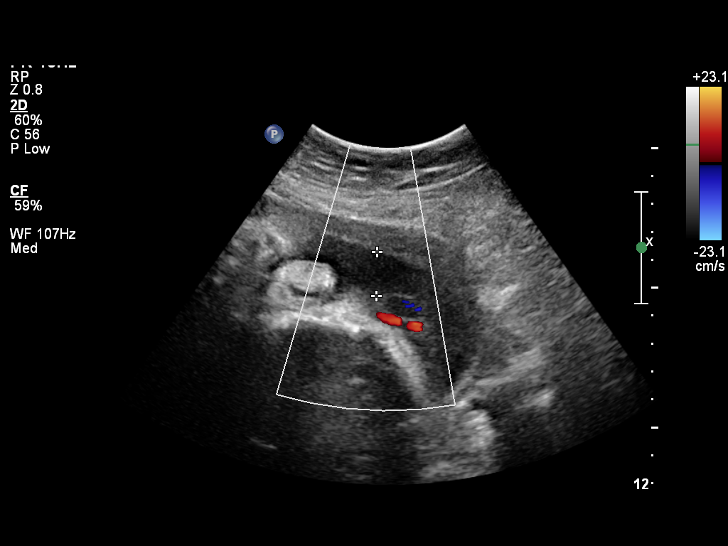
[im 35/40]
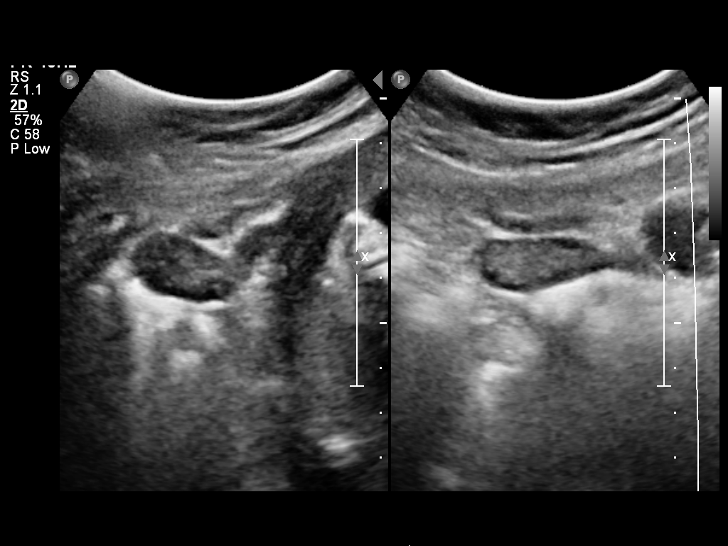
[im 38/40]
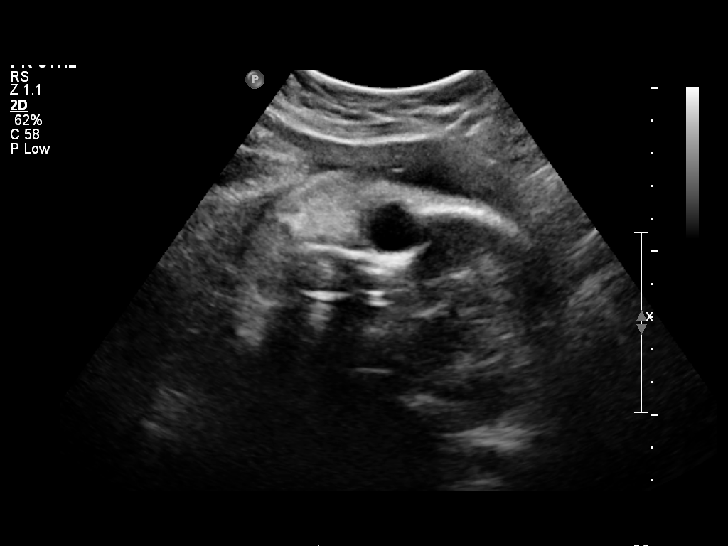

[12 of 28 positions shown; findings below may reference images not displayed]

OBSTETRICS REPORT
                      (Signed Final 12/11/2013 [DATE])

                                                         Faculty Physician
Service(s) Provided

 US OB FOLLOW UP                                       76816.1
Indications

 36 weeks gestation of pregnancy
 Evaluate anatomy not seen on prior sonogram           Z36
Fetal Evaluation

 Num Of Fetuses:    1
 Fetal Heart Rate:  144                          bpm
 Cardiac Activity:  Observed
 Presentation:      Cephalic
 Placenta:          Fundal, above cervical os
 P. Cord            Previously Visualized
 Insertion:

 Amniotic Fluid
 AFI FV:      Subjectively within normal limits
 AFI Sum:     12.52   cm       42  %Tile      Larg Pckt:   4.46  cm
 RUQ:   4.46    cm   RLQ:    3.11   cm    LUQ:   3.38    cm    LLQ:   1.57    cm
Biometry

 BPD:     85.1  mm     G. Age:  34w 2d                CI:        72.81   70 - 86
                                                      FL/HC:       21.6  20.8 -

 HC:     317.1  mm     G. Age:  35w 4d         8  %   HC/AC:       0.99  0.92 -

 AC:     319.1  mm     G. Age:  35w 6d       38   %   FL/BPD:      80.4  71 - 87
 FL:      68.4  mm     G. Age:  35w 1d       14   %   FL/AC:       21.4  20 - 24

 Est. FW:    3222   gm    5 lb 15 oz     38  %
Gestational Age

 LMP:           36w 5d        Date:  03/28/13                 EDD:   01/02/14
 U/S Today:     35w 2d                                        EDD:   01/12/14
 Best:          36w 5d     Det. By:  LMP  (03/28/13)          EDD:   01/02/14
Anatomy

 Cranium:          Appears normal         Aortic Arch:      Not well visualized
 Fetal Cavum:      Previously seen        Ductal Arch:      Not well visualized
 Ventricles:       Appears normal         Diaphragm:        Previously seen
 Choroid Plexus:   Previously seen        Stomach:          Appears normal, left
                                                            sided
 Cerebellum:       Previously seen        Abdomen:          Appears normal
 Posterior Fossa:  Previously seen        Abdominal Wall:   Not well visualized
 Nuchal Fold:      Not applicable (>20    Cord Vessels:     Previously seen
                   wks GA)
 Face:             Orbits and profile     Kidneys:          Appear normal
                   previously seen
 Lips:             Appears normal         Bladder:          Appears normal
 Heart:            Previously seen        Spine:            Previously seen
 RVOT:             Previously seen        Lower             Previously seen
                                          Extremities:
 LVOT:             Previously seen        Upper             Previously seen
                                          Extremities:

 Other:  Nasal bone previously visualized. Technically difficult due to
         advanced gestational age and fetal position.
Cervix Uterus Adnexa

 Cervix:       Not visualized (advanced GA >28wks)
 Uterus:       No abnormality visualized.
 Left Ovary:    Not visualized.
 Right Ovary:   Within normal limits.

 Adnexa:     No abnormality visualized.
Impression

 Single IUP at 36w 5d
 Normal interval anatomy, although limited due to late
 gestational age
 Normal views of the fetal lip/ nose obtained.  Limited views of
 the aortic and ductal arch again noted.
 Fetal growth is appropriate (38th %tile)
 Normal amniotic fluid volume
Recommendations

 Follow up ultrasounds as clinically indicated

 Thank you for sharing in the care of Ms. ARISANTO QUDDUS with
 questions or concerns.
